# Patient Record
Sex: Male | Born: 1950 | Hispanic: No | Marital: Married | Smoking: Never smoker
Health system: Southern US, Community
[De-identification: ages and names within clinical notes are randomized; demographics above are authoritative.]

## PROBLEM LIST (undated history)

## (undated) DIAGNOSIS — H9190 Unspecified hearing loss, unspecified ear: Secondary | ICD-10-CM

## (undated) DIAGNOSIS — I10 Essential (primary) hypertension: Secondary | ICD-10-CM

## (undated) DIAGNOSIS — E7800 Pure hypercholesterolemia, unspecified: Secondary | ICD-10-CM

## (undated) HISTORY — DX: Unspecified hearing loss, unspecified ear: H91.90

## (undated) HISTORY — DX: Essential (primary) hypertension: I10

---

## 2009-12-18 ENCOUNTER — Encounter: Admission: RE | Admit: 2009-12-18 | Discharge: 2009-12-18 | Payer: Self-pay | Admitting: Infectious Diseases

## 2013-12-27 ENCOUNTER — Ambulatory Visit (INDEPENDENT_AMBULATORY_CARE_PROVIDER_SITE_OTHER): Payer: 59 | Admitting: Family Medicine

## 2013-12-27 VITALS — BP 152/98 | HR 78 | Temp 98.1°F | Resp 18 | Ht 65.5 in | Wt 165.0 lb

## 2013-12-27 DIAGNOSIS — H9190 Unspecified hearing loss, unspecified ear: Secondary | ICD-10-CM

## 2013-12-27 DIAGNOSIS — J309 Allergic rhinitis, unspecified: Secondary | ICD-10-CM

## 2013-12-27 DIAGNOSIS — J329 Chronic sinusitis, unspecified: Secondary | ICD-10-CM

## 2013-12-27 DIAGNOSIS — J302 Other seasonal allergic rhinitis: Secondary | ICD-10-CM

## 2013-12-27 DIAGNOSIS — E785 Hyperlipidemia, unspecified: Secondary | ICD-10-CM

## 2013-12-27 DIAGNOSIS — I10 Essential (primary) hypertension: Secondary | ICD-10-CM

## 2013-12-27 MED ORDER — FLUTICASONE PROPIONATE 50 MCG/ACT NA SUSP: 2.0000 | Freq: Every day | NASAL | Status: DC

## 2013-12-27 MED ORDER — AMOXICILLIN 500 MG PO CAPS: 500.0000 mg | ORAL_CAPSULE | Freq: Two times a day (BID) | ORAL | Status: DC

## 2013-12-27 MED ORDER — CHLORTHALIDONE 25 MG PO TABS: 25.0000 mg | ORAL_TABLET | Freq: Every day | ORAL | Status: DC

## 2013-12-27 NOTE — Progress Notes (Signed)
Chief Complaint:  Chief Complaint  Patient presents with  . Muscle Pain    off and on   . Hearing Loss  . Allergies  . Hyperlipidemia  . Hypertension    HPI: Todd Luna is a 63 y.o. male who is here for: 1. Allergy sxs after cutting grass, he has no SOB, CP, wheezing. Has tried claritin, has had cough, productive green, has had sinus pressure, HA. No ear pain or sore throat.  2. 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 He has not had feers or chills.  3. HTN-was on BP meds prior, name unknown, has been in [place], does not have PCP and has not taken meds.  4. He has a history of high cholesterol, may have been on meds int he past ate today already  Past Medical History  Diagnosis Date  . Hypertension   . Hearing loss    History reviewed. No pertinent past surgical history. History   Social History  . Marital Status: Married    Spouse Name: N/A    Number of Children: N/A  . Years of Education: N/A   Social History Main Topics  . Smoking status: Never Smoker   . Smokeless tobacco: None  . Alcohol Use: No  . Drug Use: No  . Sexual Activity: None   Other Topics Concern  . None   Social History Narrative  . None   History reviewed. No pertinent family history. No Known Allergies Prior to Admission medications   Medication Sig Start Date End Date Taking? Authorizing Provider  loratadine (CLARITIN) 10 MG tablet Take 10 mg by mouth daily.   Yes Historical Provider, MD     ROS: The patient denies fevers, chills, night sweats, unintentional weight loss, chest pain, palpitations, wheezing, dyspnea on exertion, nausea, vomiting, abdominal pain, dysuria, hematuria, melena, numbness, weakness, or tingling.   All other systems have been reviewed and were otherwise negative with the exception of those mentioned in the HPI and as above.    PHYSICAL EXAM: Filed Vitals:   12/27/13 1416  BP: 152/98  Pulse: 78  Temp: 98.1 F (36.7 C)   Resp: 18   Filed Vitals:   12/27/13 1416  Height: 5' 5.5" (1.664 m)  Weight: 165 lb (74.844 kg)   Body mass index is 27.03 kg/(m^2).  General: Alert, no acute distress HEENT:  Normocephalic, atraumatic, oropharynx patent. EOMI, PERRLA, +sinus tenderness, tm nl, no exudates Cardiovascular:  Regular rate and rhythm, no rubs murmurs or gallops.  No Carotid bruits, radial pulse intact. No pedal edema.  Respiratory: Clear to auscultation bilaterally.  No wheezes, rales, or rhonchi.  No cyanosis, no use of accessory musculature GI: No organomegaly, abdomen is soft and non-tender, positive bowel sounds.  No masses. Skin: No rashes. Neurologic: Facial musculature symmetric. Psychiatric: Patient is appropriate throughout our interaction. Lymphatic: No cervical lymphadenopathy Musculoskeletal: Gait intact.   LABS: No results found for this or any previous visit.   EKG/XRAY:   [facility]   ASSESSMENT/PLAN: Encounter Diagnoses  Name Primary?  . HTN (hypertension) Yes  . Other and unspecified hyperlipidemia   . Seasonal allergies   . Sinusitis    Will get labs in AM since not fasting Refer to ENT for audiology testing and hearing issues, he has had this for awhile and failed whisper test F/u in 1 month since restarting meds [id number] ( Paul---son)  Gross sideeffects,  risk and benefits, and alternatives of medications d/w patient. Patient is aware that all medications have potential sideeffects and we are unable to predict every sideeffect or drug-drug interaction that may occur.  Lenell Antuhao P Le, DO 12/27/2013 3:12 PM

## 2013-12-28 ENCOUNTER — Other Ambulatory Visit: Payer: Self-pay | Admitting: *Deleted

## 2013-12-28 ENCOUNTER — Other Ambulatory Visit (INDEPENDENT_AMBULATORY_CARE_PROVIDER_SITE_OTHER): Payer: 59

## 2013-12-28 DIAGNOSIS — I10 Essential (primary) hypertension: Secondary | ICD-10-CM

## 2013-12-28 DIAGNOSIS — E785 Hyperlipidemia, unspecified: Secondary | ICD-10-CM

## 2013-12-28 LAB — LIPID PANEL
Cholesterol: 256 mg/dL — ABNORMAL HIGH (ref 0–200)
HDL: 49 mg/dL (ref >39)
LDL Cholesterol: 172 mg/dL — ABNORMAL HIGH (ref 0–99)
Total CHOL/HDL Ratio: 5.2 ratio
Triglycerides: 175 mg/dL — ABNORMAL HIGH (ref <150)
VLDL: 35 mg/dL (ref 0–40)

## 2013-12-28 LAB — COMPREHENSIVE METABOLIC PANEL WITH GFR
Albumin: 4.6 g/dL (ref 3.5–5.2)
Alkaline Phosphatase: 72 U/L (ref 39–117)
BUN: 12 mg/dL (ref 6–23)
Calcium: 9.5 mg/dL (ref 8.4–10.5)
Creat: 0.93 mg/dL (ref 0.50–1.35)
Sodium: 135 meq/L (ref 135–145)
Total Bilirubin: 0.6 mg/dL (ref 0.2–1.2)
Total Protein: 8 g/dL (ref 6.0–8.3)

## 2013-12-28 LAB — COMPREHENSIVE METABOLIC PANEL
ALT: 27 U/L (ref 0–53)
AST: 29 U/L (ref 0–37)
CO2: 29 mEq/L (ref 19–32)
Chloride: 99 mEq/L (ref 96–112)
Glucose, Bld: 111 mg/dL — ABNORMAL HIGH (ref 70–99)
Potassium: 4.5 mEq/L (ref 3.5–5.3)

## 2013-12-28 LAB — TSH: TSH: 1.029 u[IU]/mL (ref 0.350–4.500)

## 2013-12-28 MED ORDER — CHLORTHALIDONE 25 MG PO TABS: 12.5000 mg | ORAL_TABLET | Freq: Every day | ORAL | Status: DC

## 2013-12-28 NOTE — Progress Notes (Signed)
Patient here for labs only. 

## 2013-12-29 ENCOUNTER — Telehealth: Payer: Self-pay | Admitting: Family Medicine

## 2013-12-29 ENCOUNTER — Encounter: Payer: Self-pay | Admitting: Family Medicine

## 2013-12-29 MED ORDER — ATORVASTATIN CALCIUM 20 MG PO TABS: 20.0000 mg | ORAL_TABLET | Freq: Every day | ORAL | Status: DC

## 2013-12-29 NOTE — Telephone Encounter (Signed)
LM on phone for son Renae Fickle( Paul) that his father's Cholesterol is high. He was on some choelsterol medication years ago. With HTN this increases risk for strokes and MI so will restart him on stain. He will be given Lipitor 20 mg and continue with Chlorthalidone 25 mg 1/2 tablet PO daily. Recheck in 2 months with fasting labs. Advise to make appt 104.

## 2014-03-09 ENCOUNTER — Ambulatory Visit: Payer: 59

## 2014-03-09 ENCOUNTER — Ambulatory Visit: Payer: 59 | Attending: Family Medicine | Admitting: Audiology

## 2014-03-09 DIAGNOSIS — H748X3 Other specified disorders of middle ear and mastoid, bilateral: Secondary | ICD-10-CM

## 2014-03-09 DIAGNOSIS — H9193 Unspecified hearing loss, bilateral: Secondary | ICD-10-CM

## 2014-03-09 DIAGNOSIS — H748X9 Other specified disorders of middle ear and mastoid, unspecified ear: Secondary | ICD-10-CM | POA: Insufficient documentation

## 2014-03-09 DIAGNOSIS — H9190 Unspecified hearing loss, unspecified ear: Secondary | ICD-10-CM | POA: Insufficient documentation

## 2014-03-09 DIAGNOSIS — H903 Sensorineural hearing loss, bilateral: Secondary | ICD-10-CM | POA: Diagnosis not present

## 2014-03-09 NOTE — Patient Instructions (Addendum)
CONCLUSIONS: Todd Luna has a hearing loss in both ears that is worse on the left side.  He needs to be seen by an Ear, Nose and Throat physician first because he has noticed more hearing loss in the past two years.  Todd Luna also needs a hearing evaluation as soon as possible because this degree of hearing loss interferes with communication.  Todd Luna, Au.D., CCC-A Doctor of Audiology 03/09/2014   References:  [employer] in [facility] may help with obtaining one hearing aid or one captioned telephone if hearing loss and financial qualifications are met.  Please contact Rex Kras at [phone] .  List of Providers for [employer] [facility] March 02, 2012 - March 01, 2014. The following hearing aid companies have contracted with [place] to fit hearing aids for eligible applicants. Representatives of these companies are not [facility] employees. Inclusion on this list means the company agrees to the terms of contract pricing and inclusion is not an endorsement of their services over those who are not contracted with the division. An applicant may choose any provider listed from your region to assist in obtaining hearing aids through this service. If problems arise during this process, please contact the [place] that provided the application.  [facility] Vendor List 1 Updated; December 28, 2012   [facility]  [place]   [place]  [phone]     Hearing Loss A hearing loss is sometimes called deafness. Hearing loss may be partial or total. CAUSES Hearing loss may be caused by:  Wax in the ear canal.  Infection of the ear canal.  Infection of the middle ear.  Trauma to the ear or surrounding area.  Fluid in the middle ear.  A hole in the eardrum (perforated eardrum).  Exposure to loud sounds or music.  Problems with the hearing nerve.  Certain  medications. Hearing loss without wax, infection, or a history of injury may mean that the nerve is involved. Hearing loss with severe dizziness, nausea and vomiting or ringing in the ear may suggest a hearing nerve irritation or problems in the middle or inner ear. If hearing loss is untreated, there is a greater likelihood for residual or permanent hearing loss. DIAGNOSIS A hearing test (audiometry) assesses hearing loss. The audiometry test needs to be performed by a hearing specialist (audiologist). TREATMENT Treatment for recent onset of hearing loss may include:  Ear wax removal.  Medications that kill germs (antibiotics).  Cortisone medications.  Prompt follow up with the appropriate specialist. Return of hearing depends on the cause of your hearing loss, so proper medical follow-up is important. Some hearing loss may not be reversible, and a caregiver should discuss care and treatment options with you. SEEK MEDICAL CARE IF:   You have a severe headache, dizziness, or changes in vision.  You have new or increased weakness.  You develop repeated vomiting or other serious medical problems.  You have a fever. Document Released: 08/19/2005 Document Revised: 11/11/2011 Document Reviewed: 12/14/2009 [facility] Patient Information 2015 [place], [place]. This information is not intended to replace advice given to you by your health care provider. Make sure you discuss any questions you have with your health care provider.

## 2014-03-09 NOTE — Procedures (Signed)
[facility] [address] [place],  [address] [place] EVALUATION  Name: Todd Luna DOB:  07-10-1951 MRN:  [id number]                                 Diagnosis: hearing loss Date: 03/09/2014    Referent: Leotis Pain, DO  HISTORY: Scorpio Fortin, age 63 y.o. years, was seen for an audiological evaluation.  He speaks vietnamese only. His son, who speaks English accompanied him.  Mr. Quincy has noticed a decrease in hearing over the past four years, but the hearing has become much poorer on the left side over the past two years.  Mr. Westervelt and the family report great communication problems at home and that although he has been attending [employer] to learn English - the teacher must "sit very close to him before he can hear".  Mr. Bialecki has told his family that he has "lost his hearing" and is deaf.  Mr. Maciver denies vertigo, balance issues or tinnitus.  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         EVALUATION: Pure tone air and tone conduction was completed using conventional audiometry with inserts. The left ear is poorer with 55-65 dBHL from '250Hz' - '1000Hz' ; 80-85dBHL from '1000Hz' -'8000Hz' .  The right ear hearing thresholds are 35-50 dBHL from '250Hz'  - '1000Hz' ; 75-85 dBHL from '1500Hz'  - '8000Hz' .  The hearing loss is sensorineural bilaterally. Speech detection is 35 dBHL in the right ear and 60 dBHL in the left ear using recorded multitalker noise.  The reliability is good.  Otoscopic inspection reveals clear ear canals with cloudy tympanic membranes, without redness. Tympanometry was abnormal bilaterally because of the rounded configuration and elevated/absent acoustic reflexes.    CONCLUSION:  Mr. Mysliwiec has a hearing loss in both ears that is worse on the left side.  He needs to be seen by an Ear, Nose and Throat physician first because he has noticed more hearing loss in the past two years.  Mr. Wilkinson also needs a hearing  evaluation as soon as possible because this degree of hearing loss creates severe communication difficulty.  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  Word recognition testing was unable to be tested due to the language barrier.  [place] may be an excellent candidate for amplification therefore a hearing aid evaluation is recommended.  His son is going to call the insurance company to inquire about hearing aid coverage. However, please be aware that   RECOMMENDATIONS: 1.   Evaluation by an Ear, Nose and Throat physician because of a) reported deterioration of left ear hearing over the past two years b) asymmetrical low frequency hearing loss and c) cloudy appearance of tympanic membranes.  2.   Hearing aid evaluation once medical clearance is obtained. Please be aware that if Mr. Scerbo insurance does not pay for hearing aids that other funding may be available. [facility] ENT  [place]  [phone] participates with [employer] in [place] may help with obtaining one hearing aid or one captioned telephone if hearing loss and financial qualifications are met.  Please contact Rex Kras at [phone] .  3.   Monitor hearing closely with a repeat  audiological evaluation in 3-6 months (earlier if there is any change in hearing or ear pressure).   Vannessa Godown L. Heide Spark, Au.D., CCC-A Doctor of Audiology 03/09/2014  cc: Leotis Pain, DO

## 2014-03-10 ENCOUNTER — Ambulatory Visit (INDEPENDENT_AMBULATORY_CARE_PROVIDER_SITE_OTHER): Payer: 59 | Admitting: Family Medicine

## 2014-03-10 VITALS — BP 132/60 | HR 77 | Temp 97.8°F | Resp 16 | Ht 65.0 in | Wt 162.0 lb

## 2014-03-10 DIAGNOSIS — I10 Essential (primary) hypertension: Secondary | ICD-10-CM

## 2014-03-10 DIAGNOSIS — H9193 Unspecified hearing loss, bilateral: Secondary | ICD-10-CM

## 2014-03-10 DIAGNOSIS — M545 Low back pain, unspecified: Secondary | ICD-10-CM

## 2014-03-10 DIAGNOSIS — E785 Hyperlipidemia, unspecified: Secondary | ICD-10-CM

## 2014-03-10 DIAGNOSIS — H9190 Unspecified hearing loss, unspecified ear: Secondary | ICD-10-CM

## 2014-03-10 LAB — COMPREHENSIVE METABOLIC PANEL
ALT: 21 U/L (ref 0–53)
AST: 24 U/L (ref 0–37)
Albumin: 4.5 g/dL (ref 3.5–5.2)
Alkaline Phosphatase: 69 U/L (ref 39–117)
BILIRUBIN TOTAL: 0.6 mg/dL (ref 0.2–1.2)
BUN: 10 mg/dL (ref 6–23)
CALCIUM: 9.7 mg/dL (ref 8.4–10.5)
CO2: 27 mEq/L (ref 19–32)
Chloride: 97 mEq/L (ref 96–112)
Creat: 0.97 mg/dL (ref 0.50–1.35)
Glucose, Bld: 107 mg/dL — ABNORMAL HIGH (ref 70–99)
POTASSIUM: 4.6 meq/L (ref 3.5–5.3)
Sodium: 133 mEq/L — ABNORMAL LOW (ref 135–145)
TOTAL PROTEIN: 7.5 g/dL (ref 6.0–8.3)

## 2014-03-10 LAB — LIPID PANEL
Cholesterol: 184 mg/dL (ref 0–200)
HDL: 44 mg/dL (ref >39)
LDL CALC: 114 mg/dL — AB (ref 0–99)
Total CHOL/HDL Ratio: 4.2 Ratio
Triglycerides: 129 mg/dL (ref <150)
VLDL: 26 mg/dL (ref 0–40)

## 2014-03-10 NOTE — Progress Notes (Signed)
   Subjective:    Patient ID: Todd Luna, male    DOB: 03/31/1951, 63 y.o.   MRN: [id number]  HPI This is a very pleasant 63 yo male who is brought in today by his son in law for follow up of HTN, hyperlipidemia and request for ENT referral for bilateral hearing loss. Patient had audiology evaluation yesterday that showed the following: CONCLUSIONS:  [name] has a hearing loss in both ears that is worse on the left side. He needs to be seen by an Ear, Nose and Throat physician first because he has noticed more hearing loss in the past two years. [name] also needs a hearing evaluation as soon as possible because this degree of hearing loss interferes with communication.  Dyan Creelman L. Kate SableWoodward, Au.D., CCC-A  Doctor of Audiology  03/09/2014  He is tolerating his medications without side effects.  He reports that he has occasional low back pain with lawn mowing. It is not persistent, nor does it radiate. [place].   Review of Systems No chest pain, no SOB    Objective:   Physical Exam  Vitals reviewed. Constitutional: He is oriented to person, place, and time. He appears well-developed and well-nourished.  HENT:  Head: Normocephalic and atraumatic.  Right Ear: Tympanic membrane, external ear and ear canal normal. No decreased hearing is noted.  Left Ear: Tympanic membrane, external ear and ear canal normal. No decreased hearing is noted.  Nose: Nose normal.  Mouth/Throat: Oropharynx is clear and moist.  Eyes: Conjunctivae are normal. Right eye exhibits no discharge. Left eye exhibits no discharge. No scleral icterus.  Neck: Normal range of motion. Neck supple.  Cardiovascular: Normal rate, regular rhythm and normal heart sounds.   Pulmonary/Chest: Effort normal and breath sounds normal.  Musculoskeletal: Normal range of motion. He exhibits no edema and no tenderness.  Neurological: He is alert and oriented to person, place, and time. He has normal reflexes.  Skin:  Skin is warm and dry.  Psychiatric: He has a normal mood and affect. His behavior is normal. Judgment and thought content normal.      Assessment & Plan:  1. Essential hypertension -well controlled on current med - Comprehensive metabolic panel - Lipid panel  2. Other and unspecified hyperlipidemia - Lipid panel  3. Hearing loss, bilateral - Ambulatory referral to ENT  4. Midline low back pain without sciatica -no findings on PE -this is an intermittent problem, for which he has not tried any treatment, recommended acetaminophen and moist heat  -Follow up with Dr. Conley RollsLe in 6 months   Emi Belfasteborah B. Kirk Sampley, FNP-BC  [facility], [facility]  03/10/2014 9:30 AM

## 2014-03-10 NOTE — Patient Instructions (Signed)
Our office will call you with an appointment for the ear nose and throat doctor

## 2014-09-06 ENCOUNTER — Encounter (HOSPITAL_COMMUNITY): Payer: Self-pay | Admitting: *Deleted

## 2014-09-06 ENCOUNTER — Inpatient Hospital Stay (HOSPITAL_COMMUNITY)
Admission: EM | Admit: 2014-09-06 | Discharge: 2014-09-08 | DRG: 640 | Disposition: A | Discharge status: Home / Self Care | Payer: Medicaid Other | Attending: Internal Medicine | Admitting: Internal Medicine

## 2014-09-06 DIAGNOSIS — H903 Sensorineural hearing loss, bilateral: Secondary | ICD-10-CM | POA: Diagnosis present

## 2014-09-06 DIAGNOSIS — Z7982 Long term (current) use of aspirin: Secondary | ICD-10-CM

## 2014-09-06 DIAGNOSIS — E876 Hypokalemia: Secondary | ICD-10-CM | POA: Diagnosis present

## 2014-09-06 DIAGNOSIS — E871 Hypo-osmolality and hyponatremia: Principal | ICD-10-CM | POA: Diagnosis present

## 2014-09-06 DIAGNOSIS — R69 Illness, unspecified: Secondary | ICD-10-CM

## 2014-09-06 DIAGNOSIS — J020 Streptococcal pharyngitis: Secondary | ICD-10-CM | POA: Diagnosis present

## 2014-09-06 DIAGNOSIS — J189 Pneumonia, unspecified organism: Secondary | ICD-10-CM | POA: Diagnosis present

## 2014-09-06 DIAGNOSIS — R066 Hiccough: Secondary | ICD-10-CM | POA: Insufficient documentation

## 2014-09-06 DIAGNOSIS — J111 Influenza due to unidentified influenza virus with other respiratory manifestations: Secondary | ICD-10-CM

## 2014-09-06 DIAGNOSIS — Z8673 Personal history of transient ischemic attack (TIA), and cerebral infarction without residual deficits: Secondary | ICD-10-CM

## 2014-09-06 DIAGNOSIS — I10 Essential (primary) hypertension: Secondary | ICD-10-CM | POA: Diagnosis present

## 2014-09-06 DIAGNOSIS — R911 Solitary pulmonary nodule: Secondary | ICD-10-CM | POA: Diagnosis present

## 2014-09-06 LAB — CBC
HEMATOCRIT: 41.7 % (ref 39.0–52.0)
Hemoglobin: 13.5 g/dL (ref 13.0–17.0)
MCH: 25.1 pg — ABNORMAL LOW (ref 26.0–34.0)
MCHC: 32.4 g/dL (ref 30.0–36.0)
MCV: 77.5 fL — AB (ref 78.0–100.0)
PLATELETS: 285 10*3/uL (ref 150–400)
RBC: 5.38 MIL/uL (ref 4.22–5.81)
RDW: 12 % (ref 11.5–15.5)
WBC: 8.7 10*3/uL (ref 4.0–10.5)

## 2014-09-06 MED ORDER — SODIUM CHLORIDE 0.9 % IV BOLUS (SEPSIS)
1000.0000 mL | Freq: Once | INTRAVENOUS | Status: AC
  Administered 2014-09-06: 1000 mL via INTRAVENOUS

## 2014-09-06 MED ORDER — CHLORPROMAZINE HCL 25 MG/ML IJ SOLN
25.0000 mg | Freq: Once | INTRAMUSCULAR | Status: AC
  Administered 2014-09-06: 25 mg via INTRAVENOUS
  Filled 2014-09-06: qty 1

## 2014-09-06 NOTE — ED Provider Notes (Signed)
CSN: [id number]     Arrival date & time 09/06/14  2028 History   First MD Initiated Contact with Patient 09/06/14 2105     Chief Complaint  Patient presents with  . Hiccups  . Influenza    Patient with c/o flu like symptoms, per family member  . Sore Throat     (Consider location/radiation/quality/duration/timing/severity/associated sxs/prior Treatment) Patient is a 64 y.o. male presenting with flu symptoms and pharyngitis. The history is provided by the patient and medical records. A language interpreter was used (Pt's son).  Influenza Presenting symptoms: sore throat   Presenting symptoms: no cough, no diarrhea, no fatigue, no fever, no headaches, no nausea, no shortness of breath and no vomiting   Associated symptoms: no neck stiffness   Sore Throat Associated symptoms include a sore throat. Pertinent negatives include no abdominal pain, chest pain, coughing, diaphoresis, fatigue, fever, headaches, nausea, rash or vomiting.     Byrl Latin is a 64 y.o. 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  Patient's son translates for him. He reports 6 days ago he came down with an influenza-like illness including sore throat, rhinorrhea, sinus congestion, cough. He reports that 3 days ago he was seen in an urgent care and diagnosed with strep throat. He was given an unknown antibiotic and reports that his throat has improved since that time.  He reports that his hiccups have progressively worsened. He reports it is difficult to eat due to this. He had one episode of emesis at the beginning of his influenza-like illness but none since that time. Patient reports tactile fevers at the beginning of the illness but none since that time.  He denies chills, headache, neck pain, difficulty swallowing, tightness of breath, abdominal pain, nausea, weakness,  dysuria, hematuria, syncope.  Patient does endorse a strange discomfort in his chest which he attributes to his headache.  Patient reports that hiccups often wake him from sleep and family reports that they can see him hiccuping while he sleeps.  Past Medical History  Diagnosis Date  . Hypertension   . Hearing loss    History reviewed. No pertinent past surgical history. History reviewed. No pertinent family history. History  Substance Use Topics  . Smoking status: Never Smoker   . Smokeless tobacco: Not on file  . Alcohol Use: Yes     Comment: social    Review of Systems  Constitutional: Negative for fever, diaphoresis, appetite change, fatigue and unexpected weight change.       Hiccups  HENT: Positive for sore throat. Negative for mouth sores.   Eyes: Negative for visual disturbance.  Respiratory: Negative for cough, chest tightness, shortness of breath and wheezing.   Cardiovascular: Negative for chest pain.       Chest discomfort  Gastrointestinal: Negative for nausea, vomiting, abdominal pain, diarrhea and constipation.  Endocrine: Negative for polydipsia, polyphagia and polyuria.  Genitourinary: Negative for dysuria, urgency, frequency and hematuria.  Musculoskeletal: Negative for back pain and neck stiffness.  Skin: Negative for rash.  Allergic/Immunologic: Negative for immunocompromised state.  Neurological: Negative for syncope, light-headedness and headaches.  Hematological: Does not bruise/bleed easily.  Psychiatric/Behavioral: Negative for sleep disturbance. The patient is not nervous/anxious.       Allergies  Review of patient's allergies indicates no known allergies.  Home Medications   Prior to Admission medications   Medication  Sig Start Date End Date Taking? Authorizing Provider  amoxicillin (AMOXIL) 500 MG capsule Take 1,000 mg by mouth 2 (two) times daily. For 7 days   Yes Historical Provider, MD  aspirin-sod bicarb-citric acid (ALKA-SELTZER) 325 MG  TBEF tablet Take 325 mg by mouth every 6 (six) hours as needed (flu like symptoms).   Yes Historical Provider, MD  atorvastatin (LIPITOR) 20 MG tablet Take 1 tablet (20 mg total) by mouth at bedtime. Please call if you have constant muscle aches or pains 12/29/13  Yes Thao P Le, DO  chlorthalidone (HYGROTON) 25 MG tablet Take 0.5 tablets (12.5 mg total) by mouth daily. Blood pressure goal is less than 150/90. 12/28/13  Yes Thao P Le, DO  promethazine (PHENERGAN) 25 MG tablet Take 25 mg by mouth every 6 (six) hours as needed for nausea or vomiting (hiccups).   Yes Historical Provider, MD  fluticasone (FLONASE) 50 MCG/ACT nasal spray Place 2 sprays into both nostrils daily. Patient not taking: Reported on 09/06/2014 12/27/13   Thao P Le, DO   BP 94/48 mmHg  Pulse 83  Temp(Src) 98 F (36.7 C) (Oral)  Resp 12  SpO2 92% Physical Exam  Constitutional: He is oriented to person, place, and time. He appears well-developed and well-nourished. No distress.  Awake, alert, nontoxic appearance  HENT:  Head: Normocephalic and atraumatic.  Right Ear: Tympanic membrane, external ear and ear canal normal.  Left Ear: Tympanic membrane, external ear and ear canal normal.  Nose: Nose normal. No mucosal edema or rhinorrhea.  Mouth/Throat: Uvula is midline, oropharynx is clear and moist and mucous membranes are normal. Mucous membranes are not dry. No trismus in the jaw. No uvula swelling. No oropharyngeal exudate or tonsillar abscesses.  Posterior oropharynx with erythema and edema but no exudate on the tonsils  Eyes: Conjunctivae and EOM are normal. Pupils are equal, round, and reactive to light. No scleral icterus.  No horizontal, vertical or rotational nystagmus  Neck: Normal range of motion, full passive range of motion without pain and phonation normal. Neck supple. No tracheal tenderness, no spinous process tenderness and no muscular tenderness present. No rigidity. No erythema and normal range of motion  present. No Brudzinski's sign and no Kernig's sign noted.  Full active and passive ROM without pain No midline or paraspinal tenderness No nuchal rigidity or meningeal signs Normal Phonation  Cardiovascular: Normal rate, regular rhythm, normal heart sounds and intact distal pulses.   No murmur heard. Pulses:      Radial pulses are 2+ on the right side, and 2+ on the left side.  Pulmonary/Chest: Effort normal and breath sounds normal. No stridor. No respiratory distress. He has no decreased breath sounds. He has no wheezes. He has no rales.  Equal chest expansion  Abdominal: Soft. Bowel sounds are normal. He exhibits no mass. There is no tenderness. There is no rebound and no guarding.  Musculoskeletal: Normal range of motion. He exhibits no edema.  Lymphadenopathy:       Head (right side): Submandibular and tonsillar adenopathy present. No submental, no preauricular, no posterior auricular and no occipital adenopathy present.       Head (left side): Submandibular and tonsillar adenopathy present. No submental, no preauricular, no posterior auricular and no occipital adenopathy present.    He has no cervical adenopathy.       Right cervical: No superficial cervical, no deep cervical and no posterior cervical adenopathy present.      Left cervical: No superficial cervical, no deep cervical  and no posterior cervical adenopathy present.  Neurological: He is alert and oriented to person, place, and time. He has normal reflexes. No cranial nerve deficit. He exhibits normal muscle tone. Coordination normal.  Mental Status:  Alert, oriented, thought content appropriate. Speech fluent without evidence of aphasia. Able to follow 2 step commands without difficulty.  Cranial Nerves:  II:  Peripheral visual fields grossly normal, pupils equal, round, reactive to light III,IV, VI: ptosis not present, extra-ocular motions intact bilaterally  V,VII: smile symmetric, facial light touch sensation equal VIII:  hearing grossly normal bilaterally  IX,X: gag reflex present  XI: bilateral shoulder shrug equal and strong XII: midline tongue extension  Motor:  5/5 in upper and lower extremities bilaterally including strong and equal grip strength and dorsiflexion/plantar flexion Sensory: Pinprick and light touch normal in all extremities.  Deep Tendon Reflexes: 2+ and symmetric  Cerebellar: normal finger-to-nose with bilateral upper extremities Gait: normal gait and balance CV: distal pulses palpable throughout  No clonus  Skin: Skin is warm and dry. No rash noted. He is not diaphoretic.  Psychiatric: He has a normal mood and affect. His behavior is normal. Judgment and thought content normal.  Nursing note and vitals reviewed.   ED Course  Procedures (including critical care time) Labs Review Labs Reviewed  CBC - Abnormal; Notable for the following:    MCV 77.5 (*)    MCH 25.1 (*)    All other components within normal limits  COMPREHENSIVE METABOLIC PANEL - Abnormal; Notable for the following:    Sodium 121 (*)    Potassium 3.4 (*)    Chloride 82 (*)    Glucose, Bld 113 (*)    AST 68 (*)    All other components within normal limits  RAPID STREP SCREEN  TROPONIN I  LIPASE, BLOOD    Imaging Review Dg Chest 2 View  09/07/2014   CLINICAL DATA:  Sore throat, intermittent fever, shortness of breath and hiccups for 1 week.  EXAM: CHEST  2 VIEW  COMPARISON:  Chest radiograph December 18, 2009  FINDINGS: Cardiac silhouette is mildly enlarged, mediastinal silhouette is nonsuspicious. Low inspiratory examination with bibasilar patchy airspace opacities, no pleural effusion. No pneumothorax. Soft tissue planes and included osseous structures are nonsuspicious.  IMPRESSION: Patchy bibasilar airspace opacities may reflect atelectasis, less likely pneumonia in this low inspiratory examination.  Mild cardiomegaly.   Electronically Signed   By: Awilda Metroourtnay  Bloomer   On: 09/07/2014 01:21     EKG  Interpretation   Date/Time:  Tuesday September 06 2014 21:40:42 EST Ventricular Rate:  82 PR Interval:  172 QRS Duration: 93 QT Interval:  378 QTC Calculation: 441 R Axis:   72 Text Interpretation:  Sinus rhythm Nonspecific ST abnormality likely early  repol No old tracing to compare Confirmed by MILLER  MD, BRIAN ([id number]) on  09/06/2014 9:46:10 PM      MDM   Final diagnoses:  Intractable hiccups  Hyponatremia  Influenza-like illness    Laurelyn SickleYtueh Soja presents with persistent hiccups.  Influenza-like symptoms throughout this week with a New. Will Obtain Labs, Chest X-Ray, EKG, Give Thorazine and Reassess.  11:00PM Labs pending. Thorazine has improved hiccups.  12:58 AM CBC reassuring however patient with low sodium to 121 on CMP. Chloride 82 and potassium 3.4. Twelve-lead nonischemic. Troponin negative and lipase within normal limits. Will admit for hyponatremia of unknown origin. Chest x-ray pending.  1:25 AM Discussed with Dr. Alvester MorinNewton who will admit.    The patient was discussed with  and seen by Dr. Nehemiah Massed who agrees with the treatment plan.   BP 94/48 mmHg  Pulse 83  Temp(Src) 98 F (36.7 C) (Oral)  Resp 12  SpO2 92%   Dierdre Forth, PA-C 09/07/14 0125  Elwin Mocha, MD 09/08/14 [phone]

## 2014-09-06 NOTE — ED Notes (Signed)
Patient seen at urgent care three days ago and was dx with strep throat Patient here in ED for hiccups which started at midnight Per family members, "Something is blocked in the stomach area. He has hiccups really bad. If anything touches his stomach, he starts to hiccup real bad." Patient also with c/o "flu symptoms", per family member Patient alert and oriented x 4 in ED triage room--no hiccups at this time Family members at bedside to translate

## 2014-09-07 ENCOUNTER — Observation Stay (HOSPITAL_COMMUNITY): Payer: Medicaid Other

## 2014-09-07 ENCOUNTER — Emergency Department (HOSPITAL_COMMUNITY): Payer: Medicaid Other

## 2014-09-07 DIAGNOSIS — R066 Hiccough: Secondary | ICD-10-CM | POA: Diagnosis present

## 2014-09-07 DIAGNOSIS — E876 Hypokalemia: Secondary | ICD-10-CM

## 2014-09-07 DIAGNOSIS — J020 Streptococcal pharyngitis: Secondary | ICD-10-CM | POA: Diagnosis present

## 2014-09-07 DIAGNOSIS — H903 Sensorineural hearing loss, bilateral: Secondary | ICD-10-CM | POA: Diagnosis present

## 2014-09-07 DIAGNOSIS — Z8673 Personal history of transient ischemic attack (TIA), and cerebral infarction without residual deficits: Secondary | ICD-10-CM | POA: Diagnosis not present

## 2014-09-07 DIAGNOSIS — I10 Essential (primary) hypertension: Secondary | ICD-10-CM

## 2014-09-07 DIAGNOSIS — R911 Solitary pulmonary nodule: Secondary | ICD-10-CM | POA: Diagnosis present

## 2014-09-07 DIAGNOSIS — Z7982 Long term (current) use of aspirin: Secondary | ICD-10-CM | POA: Diagnosis not present

## 2014-09-07 DIAGNOSIS — E871 Hypo-osmolality and hyponatremia: Secondary | ICD-10-CM | POA: Diagnosis not present

## 2014-09-07 DIAGNOSIS — I517 Cardiomegaly: Secondary | ICD-10-CM

## 2014-09-07 DIAGNOSIS — J189 Pneumonia, unspecified organism: Secondary | ICD-10-CM | POA: Diagnosis present

## 2014-09-07 LAB — COMPREHENSIVE METABOLIC PANEL
ALT: 47 U/L (ref 0–53)
ALT: 40 U/L (ref 0–53)
AST: 68 U/L — ABNORMAL HIGH (ref 0–37)
AST: 53 U/L — AB (ref 0–37)
Albumin: 4 g/dL (ref 3.5–5.2)
Albumin: 3.5 g/dL (ref 3.5–5.2)
Alkaline Phosphatase: 74 U/L (ref 39–117)
Alkaline Phosphatase: 63 U/L (ref 39–117)
Anion gap: 8 (ref 5–15)
Anion gap: 10 (ref 5–15)
BUN: 9 mg/dL (ref 6–23)
BUN: 10 mg/dL (ref 6–23)
CALCIUM: 8.8 mg/dL (ref 8.4–10.5)
CHLORIDE: 82 meq/L — AB (ref 96–112)
CHLORIDE: 90 meq/L — AB (ref 96–112)
CO2: 31 mmol/L (ref 19–32)
CO2: 27 mmol/L (ref 19–32)
CREATININE: 0.79 mg/dL (ref 0.50–1.35)
CREATININE: 0.77 mg/dL (ref 0.50–1.35)
Calcium: 8.6 mg/dL (ref 8.4–10.5)
Glucose, Bld: 113 mg/dL — ABNORMAL HIGH (ref 70–99)
Glucose, Bld: 116 mg/dL — ABNORMAL HIGH (ref 70–99)
Potassium: 3.4 mmol/L — ABNORMAL LOW (ref 3.5–5.1)
Potassium: 3.6 mmol/L (ref 3.5–5.1)
SODIUM: 127 mmol/L — AB (ref 135–145)
Sodium: 121 mmol/L — ABNORMAL LOW (ref 135–145)
TOTAL PROTEIN: 7.6 g/dL (ref 6.0–8.3)
Total Bilirubin: 0.6 mg/dL (ref 0.3–1.2)
Total Bilirubin: 0.7 mg/dL (ref 0.3–1.2)
Total Protein: 6.5 g/dL (ref 6.0–8.3)

## 2014-09-07 LAB — URINALYSIS, ROUTINE W REFLEX MICROSCOPIC
BILIRUBIN URINE: NEGATIVE
Glucose, UA: NEGATIVE mg/dL
HGB URINE DIPSTICK: NEGATIVE
Ketones, ur: NEGATIVE mg/dL
Leukocytes, UA: NEGATIVE
Nitrite: NEGATIVE
PROTEIN: NEGATIVE mg/dL
Specific Gravity, Urine: 1.003 — ABNORMAL LOW (ref 1.005–1.030)
Urobilinogen, UA: 0.2 mg/dL (ref 0.0–1.0)
pH: 6.5 (ref 5.0–8.0)

## 2014-09-07 LAB — SODIUM, URINE, RANDOM: Sodium, Ur: <10 mmol/L

## 2014-09-07 LAB — CBC WITH DIFFERENTIAL/PLATELET
BASOS ABS: 0.1 10*3/uL (ref 0.0–0.1)
Basophils Relative: 1 % (ref 0–1)
EOS ABS: 0.2 10*3/uL (ref 0.0–0.7)
EOS PCT: 2 % (ref 0–5)
HEMATOCRIT: 38.8 % — AB (ref 39.0–52.0)
HEMOGLOBIN: 13.2 g/dL (ref 13.0–17.0)
Lymphocytes Relative: 18 % (ref 12–46)
Lymphs Abs: 1.6 10*3/uL (ref 0.7–4.0)
MCH: 26 pg (ref 26.0–34.0)
MCHC: 34 g/dL (ref 30.0–36.0)
MCV: 76.5 fL — ABNORMAL LOW (ref 78.0–100.0)
MONO ABS: 1 10*3/uL (ref 0.1–1.0)
Monocytes Relative: 12 % (ref 3–12)
NEUTROS PCT: 67 % (ref 43–77)
Neutro Abs: 5.9 10*3/uL (ref 1.7–7.7)
PLATELETS: 247 10*3/uL (ref 150–400)
RBC: 5.07 MIL/uL (ref 4.22–5.81)
RDW: 12.2 % (ref 11.5–15.5)
WBC: 8.7 10*3/uL (ref 4.0–10.5)

## 2014-09-07 LAB — OSMOLALITY: Osmolality: 259 mOsm/kg — ABNORMAL LOW (ref 275–300)

## 2014-09-07 LAB — LIPASE, BLOOD: LIPASE: 27 U/L (ref 11–59)

## 2014-09-07 LAB — MAGNESIUM: Magnesium: 2 mg/dL (ref 1.5–2.5)

## 2014-09-07 LAB — OSMOLALITY, URINE: Osmolality, Ur: 69 mOsm/kg — ABNORMAL LOW (ref 390–1090)

## 2014-09-07 LAB — TROPONIN I: Troponin I: <0.03 ng/mL (ref <0.031)

## 2014-09-07 LAB — RAPID STREP SCREEN (MED CTR MEBANE ONLY): STREPTOCOCCUS, GROUP A SCREEN (DIRECT): NEGATIVE

## 2014-09-07 LAB — TSH: TSH: 0.633 u[IU]/mL (ref 0.350–4.500)

## 2014-09-07 LAB — BRAIN NATRIURETIC PEPTIDE: B Natriuretic Peptide: 24.7 pg/mL (ref 0.0–100.0)

## 2014-09-07 MED ORDER — LEVOFLOXACIN IN D5W 750 MG/150ML IV SOLN
750.0000 mg | Freq: Every day | INTRAVENOUS | Status: DC
  Filled 2014-09-07: qty 150

## 2014-09-07 MED ORDER — AMOXICILLIN 500 MG PO CAPS
1000.0000 mg | ORAL_CAPSULE | Freq: Two times a day (BID) | ORAL | Status: DC
  Filled 2014-09-07 (×2): qty 2

## 2014-09-07 MED ORDER — CHLORPROMAZINE HCL 50 MG PO TABS
50.0000 mg | ORAL_TABLET | Freq: Four times a day (QID) | ORAL | Status: DC | PRN
  Administered 2014-09-07: 50 mg via ORAL
  Filled 2014-09-07 (×2): qty 1

## 2014-09-07 MED ORDER — PNEUMOCOCCAL VAC POLYVALENT 25 MCG/0.5ML IJ INJ: 0.5000 mL | INJECTION | INTRAMUSCULAR | Status: DC

## 2014-09-07 MED ORDER — SODIUM CHLORIDE 0.9 % IJ SOLN: 3.0000 mL | INTRAMUSCULAR | Status: DC | PRN

## 2014-09-07 MED ORDER — DEXTROSE 5 % IV SOLN
500.0000 mg | INTRAVENOUS | Status: DC
  Administered 2014-09-07: 500 mg via INTRAVENOUS
  Filled 2014-09-07 (×2): qty 500

## 2014-09-07 MED ORDER — SODIUM CHLORIDE 0.9 % IJ SOLN
3.0000 mL | Freq: Two times a day (BID) | INTRAMUSCULAR | Status: DC
  Administered 2014-09-07 (×3): 3 mL via INTRAVENOUS

## 2014-09-07 MED ORDER — SODIUM CHLORIDE 0.9 % IV SOLN: 250.0000 mL | INTRAVENOUS | Status: DC | PRN

## 2014-09-07 MED ORDER — HEPARIN SODIUM (PORCINE) 5000 UNIT/ML IJ SOLN
5000.0000 [IU] | Freq: Three times a day (TID) | INTRAMUSCULAR | Status: DC
  Administered 2014-09-07 – 2014-09-08 (×4): 5000 [IU] via SUBCUTANEOUS
  Filled 2014-09-07 (×7): qty 1

## 2014-09-07 MED ORDER — GUAIFENESIN 100 MG/5ML PO SOLN: 200.0000 mg | ORAL | Status: DC | PRN

## 2014-09-07 MED ORDER — SODIUM CHLORIDE 0.9 % IV SOLN
250.0000 mL | INTRAVENOUS | Status: DC
Start: 2014-09-07 — End: 2014-09-07
  Administered 2014-09-07: 250 mL via INTRAVENOUS

## 2014-09-07 MED ORDER — CEFTRIAXONE SODIUM IN DEXTROSE 20 MG/ML IV SOLN
1.0000 g | INTRAVENOUS | Status: DC
  Administered 2014-09-07: 1 g via INTRAVENOUS
  Filled 2014-09-07 (×2): qty 50

## 2014-09-07 MED ORDER — ALBUTEROL SULFATE (2.5 MG/3ML) 0.083% IN NEBU
2.5000 mg | INHALATION_SOLUTION | Freq: Three times a day (TID) | RESPIRATORY_TRACT | Status: DC
  Administered 2014-09-07 – 2014-09-08 (×4): 2.5 mg via RESPIRATORY_TRACT
  Filled 2014-09-07 (×4): qty 3

## 2014-09-07 MED ORDER — BACLOFEN 5 MG HALF TABLET
5.0000 mg | ORAL_TABLET | Freq: Two times a day (BID) | ORAL | Status: DC
  Administered 2014-09-07: 5 mg via ORAL
  Filled 2014-09-07 (×2): qty 1

## 2014-09-07 NOTE — H&P (Addendum)
Hospitalist Admission History and Physical  Patient name: Todd Luna Medical record number: [id number] Date of birth: 1951/03/09 Age: 64 y.o. Gender: male  Primary Care Provider: Rockne Coons, DO  Chief Complaint: hyponatremia, hiccups  History of Present Illness:This is a 64 y.o. year old male with significant past medical history of HTN, bilateral hearing loss presenting with hiccups, hyponatremia. Per the family, pt was recently seen at local urgent care for strep sxs. Was started on amox. Sxs have improved since this point. Per family, pt has had recurrent hiccups at home. Family states that hiccups are constant, intermittent-but seemed to be worsened by anything that touches pt's stomach. No fevers or chills. No SOB or chest. Family states that pt has been taking a lot of water as well as NSAIDs. Has noticed pt w/ some LE swelling, though no orthopnea/PND. Pt originally from [place]. Has lived in states for 5 years. No known prior medical issues.  Presents to the ER temperature 90.8, heart rate in the 80s, respirations in the tens, blood pressure in the 90s to 140s. Satting 97% on room air. White blood cell count 8.7, hemoglobin 13.5, creatinine 0.79, potassium 3.4, sodium 121. Rapid strep negative. Chest x-ray shows patchy bibasilar airspace opacities which may reflect atelectasis and less likely pneumonia. Positive mild cardiomegaly.  Assessment and Plan: Todd Luna is a 64 y.o. year old male presenting with hyponatremia,   Active Problems:   Hyponatremia   1- Hyponatremia -Broad differential diagnosis for symptoms -Fairly euvolemic with? Trace lower extremity swelling -Check BNP given cardiomegaly on chest x-ray -2-D echo -CT chest to better assess ? Infiltrate and cardiomegaly -Urine sodium -Urine and serum osmolality -Hold offending agent (chlorthalidone) -Serial bmets -fluid restrict pt  -hold treatment plan pending labs/imaging  -avoid >8-12 mEq change over next  12-24 hours.   2-hypokalemia -replete  -mag level   3- HTN -BP LLN  -hold BP regimen   4- Hiccups  -unclear etiology currently  -CT chest pending  -PPI   5- Strep throat  -rapid strep negative -strep cx -resp virus panel  -follow  FEN/GI: Heart healthy, 1200 mL fluid diet  Prophylaxis: sub q heparin  Disposition: pending further evaluation  Code Status:Full Code    Patient Active Problem List   Diagnosis Date Noted  . Hyponatremia 09/07/2014  . 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 High frequency hearing loss 03/09/2014   Past Medical History: Past Medical History  Diagnosis Date  . Hypertension   . Hearing loss     Past Surgical History: History reviewed. No pertinent past surgical history.  Social History: History   Social History  . Marital Status: Married    Spouse Name: N/A    Number of Children: N/A  . Years of Education: N/A   Social History Main Topics  . Smoking status: Never Smoker   . Smokeless tobacco: None  . Alcohol Use: Yes     Comment: social  . Drug Use: No  . Sexual Activity: None   Other Topics Concern  . None   Social History Narrative    Family History: History reviewed. No pertinent family history.  Allergies: No Known Allergies  Current Facility-Administered Medications  Medication Dose Route Frequency Provider Last Rate Last Dose  . 0.9 %  sodium chloride infusion  250 mL Intravenous PRN Doree Albee, MD      . heparin injection 5,000 Units  5,000 Units Subcutaneous 3 times per day Doree Albee, MD      .  sodium chloride 0.9 % injection 3 mL  3 mL Intravenous Q12H Doree AlbeeSteven Jakari Jacot, MD   3 mL at 09/07/14 0218  . sodium chloride 0.9 % injection 3 mL  3 mL Intravenous PRN Doree AlbeeSteven Kerrick Miler, MD       Current Outpatient Prescriptions  Medication Sig Dispense Refill  . amoxicillin (AMOXIL) 500 MG capsule Take 1,000 mg by mouth 2 (two) times daily. For 7 days    . aspirin-sod bicarb-citric acid (ALKA-SELTZER)  325 MG TBEF tablet Take 325 mg by mouth every 6 (six) hours as needed (flu like symptoms).    Marland Kitchen. atorvastatin (LIPITOR) 20 MG tablet Take 1 tablet (20 mg total) by mouth at bedtime. Please call if you have constant muscle aches or pains 30 tablet 5  . chlorthalidone (HYGROTON) 25 MG tablet Take 0.5 tablets (12.5 mg total) by mouth daily. Blood pressure goal is less than 150/90. 30 tablet 3  . promethazine (PHENERGAN) 25 MG tablet Take 25 mg by mouth every 6 (six) hours as needed for nausea or vomiting (hiccups).    . fluticasone (FLONASE) 50 MCG/ACT nasal spray Place 2 sprays into both nostrils daily. (Patient not taking: Reported on 09/06/2014) 16 g 6   Review Of Systems: 12 point ROS negative except as noted above in HPI.  Physical Exam: Filed Vitals:   09/07/14 0117  BP: 94/48  Pulse: 83  Temp: 98 F (36.7 C)  Resp: 12    General: cooperative and somnolent but arousable  HEENT: PERRLA and extra ocular movement intact Heart: S1, S2 normal, no murmur, rub or gallop, regular rate and rhythm Lungs: clear to auscultation, no wheezes or rales and unlabored breathing Abdomen: abdomen is soft without significant tenderness, masses, organomegaly or guarding Extremities: extremities normal, atraumatic, no cyanosis or edema Skin:no rashes Neurology: normal without focal findings  Labs and Imaging: Lab Results  Component Value Date/Time   NA 121* 09/06/2014 10:00 PM   K 3.4* 09/06/2014 10:00 PM   CL 82* 09/06/2014 10:00 PM   CO2 31 09/06/2014 10:00 PM   BUN 9 09/06/2014 10:00 PM   CREATININE 0.79 09/06/2014 10:00 PM   CREATININE 0.97 03/10/2014 09:36 AM   GLUCOSE 113* 09/06/2014 10:00 PM   Lab Results  Component Value Date   WBC 8.7 09/06/2014   HGB 13.5 09/06/2014   HCT 41.7 09/06/2014   MCV 77.5* 09/06/2014   PLT 285 09/06/2014   Urinalysis No results found for: COLORURINE, APPEARANCEUR, LABSPEC, PHURINE, GLUCOSEU, HGBUR, BILIRUBINUR, KETONESUR, PROTEINUR, UROBILINOGEN, NITRITE,  LEUKOCYTESUR    Dg Chest 2 View  09/07/2014   CLINICAL DATA:  Sore throat, intermittent fever, shortness of breath and hiccups for 1 week.  EXAM: CHEST  2 VIEW  COMPARISON:  Chest radiograph December 18, 2009  FINDINGS: Cardiac silhouette is mildly enlarged, mediastinal silhouette is nonsuspicious. Low inspiratory examination with bibasilar patchy airspace opacities, no pleural effusion. No pneumothorax. Soft tissue planes and included osseous structures are nonsuspicious.  IMPRESSION: Patchy bibasilar airspace opacities may reflect atelectasis, less likely pneumonia in this low inspiratory examination.  Mild cardiomegaly.   Electronically Signed   By: Awilda Metroourtnay  Bloomer   On: 09/07/2014 01:21           Doree AlbeeSteven Dennisse Swader MD  Pager: [phone]

## 2014-09-07 NOTE — Progress Notes (Addendum)
Patient ID: Todd Luna, male   DOB: [birth year], 64 y.o.   MRN: [id number] [facility] PROGRESS NOTE  Daylyn Azbill WUJ:[id number] DOB: 1951-05-18 DOA: 09/06/2014 PCP: Rockne Coons, DO  Brief narrative:  Addendum to admission note done 09/07/2014 64 year old male with past medical history of hypertension who presented to [facility] ED with ongoing hiccups. He was recently started on amoxicillin for treatment of possible strep. Patient apparently has had worsening hiccups over past few days. On this admission, patient was found to have hyponatremia of 121 and possible pneumonia.   Assessment and Plan:    Active Problems: Hiccups - unclear etiology at this time  - will try baclofen BID and see if it helps  Community acquired pneumonia - will change from amoxicillin to Zithromax and Rocephin - pt has had exposure to young family member at home with strep throat - will obtain strep pneumo and urine legionella - provide BD's scheduled and as needed  Small pulmonary nodule  - on the right side - d/w pt and family, scans reviewed with family in detail - pt is not smoker, recommend repeat CT chest in one year to ensure resolution   Hypokalemia - mild, supplemented and WNL this AM  Hyponatremia - likely pre renal in etiology - IVF provided and Na is trending up    DVT prophylaxis  HeparinSQ while pt is in hospital  Code Status: Full Family Communication: Pt and family at bedside Disposition Plan: Home in AM   IV Access:   Peripheral IV  Procedures and diagnostic studies:   Dg Chest 2 View 09/07/2014    Patchy bibasilar airspace opacities may reflect atelectasis, less likely pneumonia in this low inspiratory examination.  Mild cardiomegaly.    Ct Chest Wo Contrast 09/07/2014    Infiltration or atelectasis in the lung bases bilaterally. Findings could represent pneumonia given the clinical presentation. Coronary artery calcifications. Multiple small nodules demonstrated in the right lung  base, largest measuring 4 mm. See above recommendation.     Medical Consultants:   None   Other Consultants:   None   Anti-Infectives:   Zithromax 1/6 --> Rocephin 1/6 -->  Debbora Presto, MD  [facility] Pager [phone]  If 7PM-7AM, please contact night-coverage [email or web address] Password [email or web address] 09/07/2014, 8:52 AM   LOS: 1 day   HPI/Subjective: No events overnight.   Objective: Filed Vitals:   09/07/14 0030 09/07/14 0117 09/07/14 0359 09/07/14 0613  BP: 130/69 94/48 124/63 131/74  Pulse:  83 86 85  Temp:  98 F (36.7 C) 98.2 F (36.8 C) 98.4 F (36.9 C)  TempSrc:  Oral Oral Oral  Resp:  Height:    (1.651 m)   Weight:   74.481 kg (164 lb 3.2 oz)   SpO2:  92% 97% 96%    Intake/Output Summary (Last 24 hours) at 09/07/14 8657 Last data filed at 09/07/14 0210  Gross per 24 hour  Intake      0 ml  Output    400 ml  Net   -400 ml    Exam:   General:  Pt is  not in acute distress  Cardiovascular: Regular rate and rhythm, S1/S2 (+)  Respiratory:  no wheezing, no crackles, mild rhonchi at bases   Abdomen: Soft, non tender, non distended, bowel sounds present, no guarding  Extremities: No edema, pulses DP and PT palpable bilaterally  Neuro: Grossly nonfocal  Data Reviewed: Basic Metabolic Panel:  Recent Labs Lab 09/06/14 2200 09/07/14 0515  NA 121* 127*  K 3.4* 3.6  CL 82* 90*  CO2 31 27  GLUCOSE 113* 116*  BUN 9 10  CREATININE 0.79 0.77  CALCIUM 8.8 8.6  MG  --  2.0   Liver Function Tests:  Recent Labs Lab 09/06/14 2200 09/07/14 0515  AST 68* 53*  ALT 47 40  ALKPHOS 74 63  BILITOT 0.6 0.7  PROT 7.6 6.5  ALBUMIN 4.0 3.5    Recent Labs Lab 09/06/14 2200  LIPASE 27   CBC:  Recent Labs Lab 09/06/14 2200 09/07/14 0515  WBC 8.7 8.7  NEUTROABS  --  5.9  HGB 13.5 13.2  HCT 41.7 38.8*  MCV 77.5* 76.5*  PLT 285 247   Cardiac Enzymes:  Recent Labs Lab 09/06/14 2200  TROPONINI <0.03    Recent Results (from the  past 240 hour(s))  Rapid strep screen     Status: None   Collection Time: 09/07/14  1:15 AM  Result Value Ref Range Status   Streptococcus, Group A Screen (Direct) NEGATIVE NEGATIVE Final     Scheduled Meds: . amoxicillin  1,000 mg Oral BID  . heparin  5,000 Units Subcutaneous 3 times per day  . [START ON 09/08/2014] pneumococcal 23 valent vaccine  0.5 mL Intramuscular Tomorrow-1000  . sodium chloride  3 mL Intravenous Q12H

## 2014-09-07 NOTE — Progress Notes (Signed)
ANTIBIOTIC CONSULT NOTE - INITIAL  Pharmacy Consult for Levaquin Indication: CAP  No Known Allergies  Patient Measurements: Height: 5\' 5"  (165.1 cm) Weight: 164 lb 3.2 oz (74.481 kg) IBW/kg (Calculated) : 61.5 Adjusted Body Weight:   Vital Signs: Temp: 98.4 F (36.9 C) (01/06 0613) Temp Source: Oral (01/06 16100613) BP: 131/74 mmHg (01/06 96040613) Pulse Rate: 85 (01/06 0613) Intake/Output from previous day: 01/05 0701 - 01/06 0700 In: -  Out: 400 [Urine:400] Intake/Output from this shift:    Labs:  Recent Labs  09/06/14 2200 09/07/14 0515  WBC 8.7 8.7  HGB 13.5 13.2  PLT 285 247  CREATININE 0.79 0.77   Estimated Creatinine Clearance: 85.7 mL/min (by C-G formula based on Cr of 0.77). No results for input(s): VANCOTROUGH, VANCOPEAK, VANCORANDOM, GENTTROUGH, GENTPEAK, GENTRANDOM, TOBRATROUGH, TOBRAPEAK, TOBRARND, AMIKACINPEAK, AMIKACINTROU, AMIKACIN in the last 72 hours.   Microbiology: Recent Results (from the past 720 hour(s))  Rapid strep screen     Status: None   Collection Time: 09/07/14  1:15 AM  Result Value Ref Range Status   Streptococcus, Group A Screen (Direct) NEGATIVE NEGATIVE Final    Comment: (NOTE) A Rapid Antigen test may result negative if the antigen level in the sample is below the detection level of this test. The FDA has not cleared this test as a stand-alone test therefore the rapid antigen negative result has reflexed to a Group A Strep culture.     Medical History: Past Medical History  Diagnosis Date  . Hypertension   . Hearing loss     Assessment: [age] yoM presents with hyponatremia and hiccups. CXR shows patchy bibasilar airspace opacities which may reflect atelectasis and less likely pneumonia. Pt was taking amoxicillin outpatient and pharmacy consulted to start levaquin inpatient for CAP.    Outpatient -amoxicillin 1/2/6-->1/5/6 1/6 >> levaquin >>    Tmax: AF WBCs: WNL Renal: SCr 0.77, CrCl 86 m/min  1/6 GAS - collected 1/6  rapid strep screen - negative  Goal of Therapy:  Eradication of infection  Plan:  Levaquin 750mg  IV q24h.    Please reconsult if further dosing recommendations needed.  Thank you for the consult!  Haynes Hoehnolleen Dravon Nott, PharmD, BCPS 09/07/2014, 9:50 AM  Telephone - [phone]

## 2014-09-07 NOTE — Progress Notes (Signed)
  Echocardiogram 2D Echocardiogram has been performed.  Arvil ChacoFoster, Geraldo Haris 09/07/2014, 10:01 AM

## 2014-09-07 NOTE — Progress Notes (Signed)
UR completed 

## 2014-09-07 NOTE — ED Notes (Signed)
Patient currently has hiccups.  He doesn't speak [place].  Son-in-law and daughter are at bedside acting as interpretors.  He denies pain or needs at present time.

## 2014-09-07 NOTE — Progress Notes (Signed)
Patient is complaining of hiccups. Dr. Izola PriceMyers notified for prn medication.

## 2014-09-08 LAB — COMPREHENSIVE METABOLIC PANEL
ALBUMIN: 3.6 g/dL (ref 3.5–5.2)
ALT: 39 U/L (ref 0–53)
ANION GAP: 4 — AB (ref 5–15)
AST: 39 U/L — ABNORMAL HIGH (ref 0–37)
Alkaline Phosphatase: 70 U/L (ref 39–117)
BILIRUBIN TOTAL: 0.7 mg/dL (ref 0.3–1.2)
BUN: 10 mg/dL (ref 6–23)
CO2: 28 mmol/L (ref 19–32)
Calcium: 8.7 mg/dL (ref 8.4–10.5)
Chloride: 100 mEq/L (ref 96–112)
Creatinine, Ser: 0.86 mg/dL (ref 0.50–1.35)
GFR calc Af Amer: >90 mL/min (ref >90)
GFR calc non Af Amer: 88 mL/min — ABNORMAL LOW (ref >90)
GLUCOSE: 111 mg/dL — AB (ref 70–99)
Potassium: 3.8 mmol/L (ref 3.5–5.1)
Sodium: 132 mmol/L — ABNORMAL LOW (ref 135–145)
TOTAL PROTEIN: 7 g/dL (ref 6.0–8.3)

## 2014-09-08 LAB — CBC WITH DIFFERENTIAL/PLATELET
Basophils Absolute: 0 10*3/uL (ref 0.0–0.1)
Basophils Relative: 1 % (ref 0–1)
EOS PCT: 2 % (ref 0–5)
Eosinophils Absolute: 0.1 10*3/uL (ref 0.0–0.7)
HEMATOCRIT: 38.2 % — AB (ref 39.0–52.0)
HEMOGLOBIN: 12.6 g/dL — AB (ref 13.0–17.0)
LYMPHS ABS: 1.9 10*3/uL (ref 0.7–4.0)
LYMPHS PCT: 23 % (ref 12–46)
MCH: 25.9 pg — ABNORMAL LOW (ref 26.0–34.0)
MCHC: 33 g/dL (ref 30.0–36.0)
MCV: 78.4 fL (ref 78.0–100.0)
Monocytes Absolute: 0.7 10*3/uL (ref 0.1–1.0)
Monocytes Relative: 8 % (ref 3–12)
NEUTROS ABS: 5.4 10*3/uL (ref 1.7–7.7)
Neutrophils Relative %: 66 % (ref 43–77)
PLATELETS: 274 10*3/uL (ref 150–400)
RBC: 4.87 MIL/uL (ref 4.22–5.81)
RDW: 12.3 % (ref 11.5–15.5)
WBC: 8.1 10*3/uL (ref 4.0–10.5)

## 2014-09-08 LAB — CULTURE, GROUP A STREP

## 2014-09-08 MED ORDER — CHLORPROMAZINE HCL 50 MG PO TABS: 50.0000 mg | ORAL_TABLET | Freq: Four times a day (QID) | ORAL | Status: DC | PRN

## 2014-09-08 MED ORDER — AZITHROMYCIN 250 MG PO TABS: 250.0000 mg | ORAL_TABLET | Freq: Every day | ORAL | Status: DC

## 2014-09-08 NOTE — Discharge Instructions (Signed)
Viêm ph?i °(Pneumonia) °Viêm ph?i là b?nh nhi?m trùng ph?i.  °NGUYÊN NHÂN °Viêm ph?i có th? do vi khu?n ho?c vi rút gây ra. Thông th??ng, các nhi?m trùng này x?y ra do hít các h?t mang m?m b?nh vào ph?i (???ng hô h?p). °D?U HI?U VÀ TRI?U CH?NG  °· Ho. °· S?t. °· ?au ng?c. °· Nh?p th? t?ng lên. °· Th? khò khè. °· S?n sinh d?ch nh?y. °CH?N ?OÁN  °N?u quý v? có các tri?u ch?ng ph? bi?n c?a b?nh viêm ph?i, chuyên gia ch?m sóc s?c kh?e c?a quý v? th??ng s? xác nh?n ch?n ?oán b?ng X-quang ph?i. X-quang s? hi?n th? ph?n b?t th??ng trong ph?i (thâm nhi?m ph?i) n?u quý v? b? viêm ph?i. Các xét nghi?m máu, n??c ti?u ho?c ??m khác có th? ???c th?c hi?n ?? tìm ra nguyên nhân c? th? gây b?nh viêm ph?i c?a quý v?. Chuyên gia ch?m sóc s?c kh?e c?a quý v? có th? c?ng làm các xét nghi?m (khí máu ho?c ?o ?? bão hòa oxy trong máu) ?? xem ph?i c?a quý v? ?ang ho?t ??ng nh? th? nào. °?I?U TR?  °M?t s? d?ng viêm ph?i có th? lây lan sang ng??i khác khi quý v? ho ho?c h?t h?i. Quý v? có th? ???c yêu c?u ?eo kh?u trang tr??c và trong quá trình khám. Viêm ph?i do vi khu?n gây ra ???c ?i?u tr? b?ng thu?c kháng sinh. Viêm ph?i do vi rút cúm gây ra có th? ???c ?i?u tr? b?ng thu?c kháng vi rút. H?u h?t các b?nh nhi?m trùng do vi rút khác ph?i ?i h?t ti?n trình c?a b?nh. Các b?nh nhi?m trùng này s? không ?áp ?ng v?i thu?c kháng sinh.  °H??NG D?N CH?M SÓC T?I NHÀ  °· Thu?c ho có th? ???c s? d?ng n?u quý v? không ???c ngh? ng?i nhi?u. Tuy nhiên, ho b?o v? quý v? b?ng cách làm s?ch ph?i. Quý v? nên tránh s? d?ng thu?c ho n?u có th?. °· Chuyên gia ch?m sóc s?c kh?e c?a quý v? có th? ?ã kê ??n thu?c n?u ngh? r?ng quý v? b? viêm ph?i do vi khu?n ho?c do cúm. Hãy dùng h?t thu?c ngay c? khi quý v? b?t ??u c?m th?y khá h?n. °· Chuyên gia ch?m sóc s?c kh?e c?ng có th? kê ??n thu?c long ??m. Thu?c này làm l?ng d?ch nh?y ?? ho ra ngoài. °· Ch? s? d?ng thu?c theo ch? d?n c?a chuyên gia ch?m sóc s?c kh?e. °· Không hút thu?c. Hút thu?c lá là nguyên nhân ph?  bi?n gây viêm ph? qu?n và có th? góp ph?n gây viêm ph?i. N?u quý v? hút thu?c và ti?p t?c hút thu?c, ch?ng ho c?a quý v? có th? kéo dài vài tu?n sau khi h?t viêm ph?i. °· Máy phun h?i n??c mát ho?c máy t?o h?i ?m trong phòng ho?c trong nhà quý v? có th? giúp làm l?ng d?ch nh?y. °· Ho th??ng n?ng h?n vào ban ?êm. Ng? ? t? th? n?a n?m n?a ng?i trong m?t chi?c gh? t?a ho?c s? d?ng m?t ?ôi g?i d??i ??u s? giúp làm d?u b?t v?n ?? này. °· Ngh? ng?i khi quý v? c?m th?y c?n. C? th? c?a quý v? th??ng s? cho quý v? bi?t khi nào c?n ngh? ng?i. °PHÒNG NG?A °Có th? tiêm (v?c xin) ph? c?u khu?n ?? tránh vi khu?n thông th??ng gây viêm ph?i. Tiêm v?c xin th??ng ???c ?? ngh? cho: °· Nh?ng ng??i trên 65 tu?i. °· B?nh nhân ?ang dùng hóa tr?   li?u. °· Nh?ng ng??i có v?n ?? v? ph?i m?n tính, ch?ng h?n nh? viêm ti?u ph? qu?n ho?c khí ph? th?ng. °· Nh?ng ng??i có v?n ?? v? h? th?ng mi?n d?ch. °N?u quý v? trên 65 tu?i ho?c có tình tr?ng nguy c? cao, quý v? có th? ???c tiêm v?cxin ph? c?u khu?n n?u quý v? ch?a ???c tiêm tr??c ?ó. ? m?t s? n??c, v?cxin cúm thông th??ng c?ng ???c ?? ngh?. V?cxin này có th? giúp phòng ng?a m?t s? tr??ng h?p viêm ph?i. Quý v? có th? s? ???c cung c?p v?cxin cúm nh? là m?t ph?n c?a d?ch v? ch?m sóc. °N?u quý v? hút thu?c, ?ã t?i lúc b? thu?c. Quý v? có th? ???c h??ng d?n v? cách d?ng hút thu?c. Chuyên gia ch?m sóc s?c kh?e c?a quý v? có th? cung c?p thu?c và t? v?n ?? giúp quý v? b? thu?c lá. °?I KHÁM N?U: °Quý v? b? s?t. °NGAY L?P T?C ?I KHÁM N?U:  °· B?nh c?a quý v? tr? nên t? h?n. ?i?u này ??c bi?t ?úng n?u quý v? cao tu?i ho?c ?ã b? y?u do b?t k? b?nh nào khác. °· Quý v? không th? ki?m soát c?n ho c?a mình b?ng thu?c ho và ?ang b? m?t ng?. °· Quý v? b?t ??u ho ra máu. °· Quý v? b? ?au và tr? nên n?ng h?n ho?c không ki?m soát ???c b?ng thu?c. °· B?t k? tri?u ch?ng nào ban ??u khi?n quý v? ph?i ??n ?i?u tr? tr? nên nghiêm tr?ng h?n thay vì t?t h?n. °· Quý v? b? khó th? ho?c ?au ng?c. °??M B?O QUÝ V?:  °· Hi?u rõ  các h??ng d?n này. °· S? theo dõi tình tr?ng c?a mình. °· S? yêu c?u tr? giúp ngay l?p t?c n?u quý v? c?m th?y không kh?e ho?c th?y tr?m tr?ng h?n. °Document Released: 08/19/2005 Document Revised: 01/03/2014 °ExitCare® Patient Information ©2015 [employer]. This information is not intended to replace advice given to you by your health care provider. Make sure you discuss any questions you have with your health care provider. ° °

## 2014-09-08 NOTE — Discharge Summary (Signed)
Physician Discharge Summary  Todd Luna WUJ:[id number] DOB: [birth year] DOA: 09/06/2014  PCP: Rockne Coons, DO  Admit date: 09/06/2014 Discharge date: 09/08/2014  Recommendations for Outpatient Follow-up:  1. Pt will need to follow up with PCP in 2-3 weeks post discharge 2. Please obtain BMP to evaluate electrolytes and kidney function 3. Please also check CBC to evaluate Hg and Hct levels 4. Please note that statin was stopped due to muscle pain and elevated liver enzymes 5. Pt also did not want to take BP medications due to muscle aches 6. Pt to complete Zithromax for 7 more days post discharge 7. Thorazine started for hiccups  8. Please note that we recommend repeat CT chest in one year to ensure resolution of the pulmonary nodule noted below on Ct chest   Discharge Diagnoses:  Active Problems:   Hyponatremia   Intractable hiccups   Discharge Condition: Stable  Diet recommendation: Heart healthy diet discussed in details   Brief narrative: 64 year old male with past medical history of hypertension who presented to [facility] ED with ongoing hiccups. He was recently started on amoxicillin for treatment of possible strep. Patient apparently has had worsening hiccups over past few days. On this admission, patient was found to have hyponatremia of 121 and possible pneumonia.   Assessment and Plan:    Active Problems: Hiccups - unclear etiology at this time  - thorazine started and now better   Community acquired pneumonia - pt stable this AM with oxygen saturations at target range on RA, wants to go home  - pt has had exposure to young family member at home with strep throat - continue Zithromax upon discharge for 7 more days   Small pulmonary nodule  - on the right side - d/w pt and family, scans reviewed with family in detail - pt is not smoker, recommend repeat CT chest in one year to ensure resolution   Hypokalemia - mild, supplemented and WNL this AM  Hyponatremia -  likely pre renal in etiology - responded well to IVF   DVT prophylaxis  HeparinSQ while pt is in hospital  Code Status: Full Family Communication: Pt and family at bedside Disposition Plan: Home   IV Access:    Peripheral IV  Procedures and diagnostic studies:   Dg Chest 2 View 09/07/2014 Patchy bibasilar airspace opacities may reflect atelectasis, less likely pneumonia in this low inspiratory examination. Mild cardiomegaly.   Ct Chest Wo Contrast 09/07/2014 Infiltration or atelectasis in the lung bases bilaterally. Findings could represent pneumonia given the clinical presentation. Coronary artery calcifications. Multiple small nodules demonstrated in the right lung base, largest measuring 4 mm. See above recommendation.   Medical Consultants:    None  Other Consultants:    None  Anti-Infectives:    Zithromax 1/6 -->   Rocephin 1/6 --> 1/7   Discharge Exam: Filed Vitals:   09/08/14 0605  BP: 129/76  Pulse: 92  Temp: 98.1 F (36.7 C)  Resp: 18   Filed Vitals:   09/07/14 1354 09/07/14 1953 09/07/14 2053 09/08/14 0605  BP: 132/72  128/56 129/76  Pulse: 91  98 92  Temp: 97.3 F (36.3 C)  98.4 F (36.9 C) 98.1 F (36.7 C)  TempSrc: Oral  Oral Oral  Resp: Height:      Weight:      SpO2: 97% 96% 96% 97%    General: Pt is alert, follows commands appropriately, not in acute distress Cardiovascular: Regular rate and rhythm,  S1/S2 +, no murmurs, no rubs, no gallops Respiratory: Clear to auscultation bilaterally, no wheezing, no crackles, no rhonchi Abdominal: Soft, non tender, non distended, bowel sounds +, no guarding  Discharge Instructions  Discharge Instructions    Diet - low sodium heart healthy    Complete by:  As directed      Increase activity slowly    Complete by:  As directed             Medication List    STOP taking these medications        amoxicillin 500 MG capsule  Commonly known as:  AMOXIL      atorvastatin 20 MG tablet  Commonly known as:  LIPITOR     chlorthalidone 25 MG tablet  Commonly known as:  HYGROTON     promethazine 25 MG tablet  Commonly known as:  PHENERGAN      TAKE these medications        aspirin-sod bicarb-citric acid 325 MG Tbef tablet  Commonly known as:  ALKA-SELTZER  Take 325 mg by mouth every 6 (six) hours as needed (flu like symptoms).     azithromycin 250 MG tablet  Commonly known as:  ZITHROMAX  Take 1 tablet (250 mg total) by mouth daily.     chlorproMAZINE 50 MG tablet  Commonly known as:  THORAZINE  Take 1 tablet (50 mg total) by mouth 4 (four) times daily as needed for hiccoughs.     fluticasone 50 MCG/ACT nasal spray  Commonly known as:  FLONASE  Place 2 sprays into both nostrils daily.           Follow-up Information    Follow up with LE, THAO PHUONG, DO.   Specialty:  Family Medicine   Contact information:   [address] [place] [address] [phone]       Follow up with Christia ReadingBATES, DWIGHT, MD.   Specialty:  Otolaryngology   Contact information:   [address] [place] [address] [phone]        The results of significant diagnostics from this hospitalization (including imaging, microbiology, ancillary and laboratory) are listed below for reference.     Microbiology: Recent Results (from the past 240 hour(s))  Rapid strep screen     Status: None   Collection Time: 09/07/14  1:15 AM  Result Value Ref Range Status   Streptococcus, Group A Screen (Direct) NEGATIVE NEGATIVE Final    Comment: (NOTE) A Rapid Antigen test may result negative if the antigen level in the sample is below the detection level of this test. The FDA has not cleared this test as a stand-alone test therefore the rapid antigen negative result has reflexed to a Group A Strep culture.      Labs: Basic Metabolic Panel:  Recent Labs Lab 09/06/14 2200 09/07/14 0515 09/08/14 0625  NA 121* 127* 132*  K 3.4* 3.6 3.8   CL 82* 90* 100  CO2 31 27 28   GLUCOSE 113* 116* 111*  BUN 9 10 10   CREATININE 0.79 0.77 0.86  CALCIUM 8.8 8.6 8.7  MG  --  2.0  --    Liver Function Tests:  Recent Labs Lab 09/06/14 2200 09/07/14 0515 09/08/14 0625  AST 68* 53* 39*  ALT 47 40 39  ALKPHOS 74 63 70  BILITOT 0.6 0.7 0.7  PROT 7.6 6.5 7.0  ALBUMIN 4.0 3.5 3.6    Recent Labs Lab 09/06/14 2200  LIPASE 27   No results for input(s):  AMMONIA in the last 168 hours. CBC:  Recent Labs Lab 09/06/14 2200 09/07/14 0515 09/08/14 0625  WBC 8.7 8.7 8.1  NEUTROABS  --  5.9 5.4  HGB 13.5 13.2 12.6*  HCT 41.7 38.8* 38.2*  MCV 77.5* 76.5* 78.4  PLT 285 247 274   Cardiac Enzymes:  Recent Labs Lab 09/06/14 2200  TROPONINI <0.03   BNP: BNP (last 3 results) No results for input(s): PROBNP in the last 8760 hours. CBG: No results for input(s): GLUCAP in the last 168 hours.   SIGNED: Time coordinating discharge: Over 30 minutes  Debbora Presto, MD  [facility] 09/08/2014, 10:02 AM Pager [phone]  If 7PM-7AM, please contact night-coverage [email or web address] Password [email or web address]

## 2014-09-08 NOTE — Progress Notes (Addendum)
DC instructions reviewed with patient and son. Home med rec carefully reviewed and discussed with patient what meds to take and when. 2Rxs given. Patient instructed to make sure that he takes the entire 7 day course of the antibiotic. Patient also instructed that thorazine was being given to him prn hiccups. IV DCed. No changes noted since am assessment. Patient to DC to home with son. Patient instructed to make sure that he has a f/u with his PCP and to notify them of his admit to the hospital. Patient and son denied further questions or concerns. Dr. Izola PriceMyers reported that ordered urine sample was not needed prior to DC home.

## 2014-09-30 ENCOUNTER — Ambulatory Visit: Payer: 59 | Admitting: Family Medicine

## 2015-04-03 ENCOUNTER — Other Ambulatory Visit: Payer: Self-pay | Admitting: Otolaryngology

## 2015-04-03 DIAGNOSIS — H905 Unspecified sensorineural hearing loss: Secondary | ICD-10-CM

## 2015-04-25 ENCOUNTER — Ambulatory Visit
Admission: RE | Admit: 2015-04-25 | Discharge: 2015-04-25 | Disposition: A | Discharge status: Home / Self Care | Payer: Medicaid Other | Source: Ambulatory Visit | Attending: Otolaryngology | Admitting: Otolaryngology

## 2015-04-25 DIAGNOSIS — H905 Unspecified sensorineural hearing loss: Secondary | ICD-10-CM

## 2015-04-25 MED ORDER — GADOBENATE DIMEGLUMINE 529 MG/ML IV SOLN
15.0000 mL | Freq: Once | INTRAVENOUS | Status: AC | PRN
  Administered 2015-04-25: 15 mL via INTRAVENOUS

## 2019-04-26 ENCOUNTER — Other Ambulatory Visit: Payer: Self-pay

## 2019-04-26 ENCOUNTER — Emergency Department (HOSPITAL_COMMUNITY)
Admission: EM | Admit: 2019-04-26 | Discharge: 2019-04-26 | Disposition: A | Discharge status: Home / Self Care | Payer: Medicare Other | Attending: Emergency Medicine | Admitting: Emergency Medicine

## 2019-04-26 ENCOUNTER — Encounter (HOSPITAL_COMMUNITY): Payer: Self-pay | Admitting: Emergency Medicine

## 2019-04-26 DIAGNOSIS — R110 Nausea: Secondary | ICD-10-CM

## 2019-04-26 DIAGNOSIS — R112 Nausea with vomiting, unspecified: Secondary | ICD-10-CM | POA: Diagnosis present

## 2019-04-26 DIAGNOSIS — I10 Essential (primary) hypertension: Secondary | ICD-10-CM | POA: Diagnosis not present

## 2019-04-26 HISTORY — DX: Pure hypercholesterolemia, unspecified: E78.00

## 2019-04-26 LAB — COMPREHENSIVE METABOLIC PANEL
ALT: 27 U/L (ref 0–44)
AST: 26 U/L (ref 15–41)
Albumin: 4.3 g/dL (ref 3.5–5.0)
Alkaline Phosphatase: 63 U/L (ref 38–126)
Anion gap: 12 (ref 5–15)
BUN: 11 mg/dL (ref 8–23)
CO2: 24 mmol/L (ref 22–32)
Calcium: 9.3 mg/dL (ref 8.9–10.3)
Chloride: 99 mmol/L (ref 98–111)
Creatinine, Ser: 0.97 mg/dL (ref 0.61–1.24)
GFR calc Af Amer: >60 mL/min (ref >60)
GFR calc non Af Amer: >60 mL/min (ref >60)
Glucose, Bld: 142 mg/dL — ABNORMAL HIGH (ref 70–99)
Potassium: 4.1 mmol/L (ref 3.5–5.1)
Sodium: 135 mmol/L (ref 135–145)
Total Bilirubin: 1.1 mg/dL (ref 0.3–1.2)
Total Protein: 7.5 g/dL (ref 6.5–8.1)

## 2019-04-26 LAB — CBC WITH DIFFERENTIAL/PLATELET
Abs Immature Granulocytes: 0.03 10*3/uL (ref 0.00–0.07)
Basophils Absolute: 0 10*3/uL (ref 0.0–0.1)
Basophils Relative: 0 %
Eosinophils Absolute: 0 10*3/uL (ref 0.0–0.5)
Eosinophils Relative: 0 %
HCT: 42.2 % (ref 39.0–52.0)
Hemoglobin: 13.9 g/dL (ref 13.0–17.0)
Immature Granulocytes: 0 %
Lymphocytes Relative: 4 %
Lymphs Abs: 0.3 10*3/uL — ABNORMAL LOW (ref 0.7–4.0)
MCH: 27 pg (ref 26.0–34.0)
MCHC: 32.9 g/dL (ref 30.0–36.0)
MCV: 81.9 fL (ref 80.0–100.0)
Monocytes Absolute: 0.3 10*3/uL (ref 0.1–1.0)
Monocytes Relative: 4 %
Neutro Abs: 7.3 10*3/uL (ref 1.7–7.7)
Neutrophils Relative %: 92 %
Platelets: 243 10*3/uL (ref 150–400)
RBC: 5.15 MIL/uL (ref 4.22–5.81)
RDW: 12.6 % (ref 11.5–15.5)
WBC: 8 10*3/uL (ref 4.0–10.5)
nRBC: 0 % (ref 0.0–0.2)

## 2019-04-26 LAB — LIPASE, BLOOD: Lipase: 26 U/L (ref 11–51)

## 2019-04-26 MED ORDER — FAMOTIDINE 20 MG PO TABS: 20.0000 mg | ORAL_TABLET | Freq: Two times a day (BID) | ORAL | 0 refills | Status: AC

## 2019-04-26 MED ORDER — ONDANSETRON HCL 4 MG/2ML IJ SOLN
4.0000 mg | Freq: Once | INTRAMUSCULAR | Status: AC
  Administered 2019-04-26: 4 mg via INTRAVENOUS
  Filled 2019-04-26: qty 2

## 2019-04-26 MED ORDER — SODIUM CHLORIDE 0.9 % IV BOLUS
500.0000 mL | Freq: Once | INTRAVENOUS | Status: AC
Start: 2019-04-26 — End: 2019-04-26
  Administered 2019-04-26: 500 mL via INTRAVENOUS

## 2019-04-26 MED ORDER — FAMOTIDINE IN NACL 20-0.9 MG/50ML-% IV SOLN
20.0000 mg | Freq: Once | INTRAVENOUS | Status: AC
  Administered 2019-04-26: 15:00:00 20 mg via INTRAVENOUS
  Filled 2019-04-26: qty 50

## 2019-04-26 MED ORDER — ONDANSETRON HCL 4 MG PO TABS: 4.0000 mg | ORAL_TABLET | Freq: Four times a day (QID) | ORAL | 0 refills | Status: AC

## 2019-04-26 NOTE — Discharge Instructions (Signed)
Please return for any problem.  Follow-up with your regular care provider tomorrow. °

## 2019-04-26 NOTE — ED Provider Notes (Signed)
[facility]-EMERGENCY DEPT Provider Note   CSN: [id number] Arrival date & time: 04/26/19  1356     History   Chief Complaint Chief Complaint  Patient presents with  . Gastroesophageal Reflux    HPI Todd Luna is a 68 y.o. male.     68 year old male [place] speaking, [place] speaking, with medical history as detailed below presents for evaluation of nausea, vomiting, and epigastric discomfort.  Patient reports onset of symptoms approximately 1 week ago.  Patient denies associated fever.  He reports multiple episodes of emesis at home.  No bloody emesis noted.  No change in his bowel movements reported.  Patient denies associated chest pain or shortness of breath.  The history is provided by the patient and medical records. A language interpreter was used.  Gastroesophageal Reflux This is a new problem. The current episode started more than 2 days ago. The problem occurs constantly. The problem has not changed since onset.Associated symptoms include abdominal pain. Pertinent negatives include no chest pain, no headaches and no shortness of breath. Nothing aggravates the symptoms. Nothing relieves the symptoms.    Past Medical History:  Diagnosis Date  . Hearing loss   . High cholesterol   . Hypertension     Patient Active Problem List   Diagnosis Date Noted  . Hyponatremia 09/07/2014  . Intractable hiccups   . 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 High frequency hearing loss 03/09/2014    History reviewed. No pertinent surgical history.      Home Medications    Prior to Admission medications   Medication Sig Start Date End Date Taking? Authorizing Provider  aspirin-sod bicarb-citric acid (ALKA-SELTZER) 325 MG TBEF tablet Take 325 mg by mouth every 6 (six) hours as needed (flu like symptoms).    [provider]  azithromycin (ZITHROMAX) 250 MG tablet Take 1 tablet (250 mg total) by mouth daily. 09/08/14   Dorothea OgleMyers, Iskra M,  MD  chlorproMAZINE (THORAZINE) 50 MG tablet Take 1 tablet (50 mg total) by mouth 4 (four) times daily as needed for hiccoughs. 09/08/14   Dorothea OgleMyers, Iskra M, MD  fluticasone [facility](FLONASE) 50 MCG/ACT nasal spray Place 2 sprays into both nostrils daily. Patient not taking: Reported on 09/06/2014 12/27/13   Lenell AntuLe, Thao P, DO    Family History No family history on file.  Social History Social History   Tobacco Use  . Smoking status: Never Smoker  . Smokeless tobacco: Never Used  Substance Use Topics  . Alcohol use: Yes    Comment: social  . Drug use: No     Allergies   Patient has no known allergies.   Review of Systems Review of Systems  Respiratory: Negative for shortness of breath.   Cardiovascular: Negative for chest pain.  Gastrointestinal: Positive for abdominal pain.  Neurological: Negative for headaches.  All other systems reviewed and are negative.    Physical Exam Updated Vital Signs BP (!) 163/85 (BP Location: Left Arm)   Pulse 87   Temp 97.8 F (36.6 C) (Oral)   Resp 16   SpO2 97%   Physical Exam Vitals signs and nursing note reviewed.  Constitutional:      General: He is not in acute distress.    Appearance: Normal appearance. He is well-developed.  HENT:     Head: Normocephalic and atraumatic.  Eyes:     Conjunctiva/sclera: Conjunctivae normal.     Pupils: Pupils are equal, round, and reactive to light.  Neck:  Musculoskeletal: Normal range of motion and neck supple.  Cardiovascular:     Rate and Rhythm: Normal rate and regular rhythm.     Heart sounds: Normal heart sounds.  Pulmonary:     Effort: Pulmonary effort is normal. No respiratory distress.     Breath sounds: Normal breath sounds.  Abdominal:     General: There is no distension.     Palpations: Abdomen is soft.     Tenderness: There is no abdominal tenderness.  Musculoskeletal: Normal range of motion.        General: No deformity.  Skin:    General: Skin is warm and dry.  Neurological:      Mental Status: He is alert and oriented to person, place, and time.      ED Treatments / Results  Labs (all labs ordered are listed, but only abnormal results are displayed) Labs Reviewed  COMPREHENSIVE METABOLIC PANEL - Abnormal; Notable for the following components:      Result Value   Glucose, Bld 142 (*)    All other components within normal limits  CBC WITH DIFFERENTIAL/PLATELET - Abnormal; Notable for the following components:   Lymphs Abs 0.3 (*)    All other components within normal limits  LIPASE, BLOOD    EKG None  Radiology No results found.  Procedures Procedures (including critical care time)  Medications Ordered in ED Medications  famotidine (PEPCID) IVPB 20 mg premix (20 mg Intravenous New Bag/Given 04/26/19 1520)  sodium chloride 0.9 % bolus 500 mL (500 mLs Intravenous New Bag/Given 04/26/19 1520)  ondansetron (ZOFRAN) injection 4 mg (4 mg Intravenous Given 04/26/19 1520)     Initial Impression / Assessment and Plan / ED Course  I have reviewed the triage vital signs and the nursing notes.  Pertinent labs & imaging results that were available during my care of the patient were reviewed by me and considered in my medical decision making (see chart for details).        MDM  Screen complete  Todd Luna was evaluated in Emergency Department on 04/26/2019 for the symptoms described in the history of present illness. He was evaluated in the context of the global COVID-19 pandemic, which necessitated consideration that the patient might be at risk for infection with the SARS-CoV-2 virus that causes COVID-19. Institutional protocols and algorithms that pertain to the evaluation of patients at risk for COVID-19 are in a state of rapid change based on information released by regulatory bodies including the CDC and federal and state organizations. These policies and algorithms were followed during the patient's care in the ED.  Patient is presenting for evaluation  of nausea, vomiting, and symptoms of dyspepsia.  His evaluation he feels improved.  Screening labs obtained are without significant abnormality.  Patient desires discharge.  He is aware of need for close follow-up.  Strict return precautions given and understood.  All discharge instructions given with assistance of [place].   Final Clinical Impressions(s) / ED Diagnoses   Final diagnoses:  Nausea  Nausea and vomiting, intractability of vomiting not specified, unspecified vomiting type    ED Discharge Orders         Ordered    famotidine (PEPCID) 20 MG tablet  2 times daily     04/26/19 1656    ondansetron (ZOFRAN) 4 MG tablet  Every 6 hours     04/26/19 1656           Valarie Merino, MD 04/26/19 1657

## 2019-04-26 NOTE — ED Notes (Addendum)
Interpreter #[id number] used. Discharge instructions reviewed with patient. Patient verbalizes understanding. VSS.

## 2019-04-26 NOTE — ED Triage Notes (Signed)
[place] interpretor [place] [phone]. Pt states has Indigestion and full of gas or bloating for about week. Pt reports when he eats he gets gas build up and nausea, past few days hasnt vomited but feels tired from it. Pt states" last BM was this morning and had lots of farting and gas". Denies pain.

## 2019-04-29 ENCOUNTER — Other Ambulatory Visit: Payer: Self-pay

## 2019-04-29 ENCOUNTER — Encounter (HOSPITAL_COMMUNITY): Payer: Self-pay

## 2019-04-29 ENCOUNTER — Emergency Department (HOSPITAL_COMMUNITY)
Admission: EM | Admit: 2019-04-29 | Discharge: 2019-04-30 | Disposition: A | Discharge status: Home / Self Care | Payer: Medicare Other | Attending: Emergency Medicine | Admitting: Emergency Medicine

## 2019-04-29 DIAGNOSIS — R42 Dizziness and giddiness: Secondary | ICD-10-CM | POA: Diagnosis not present

## 2019-04-29 DIAGNOSIS — R109 Unspecified abdominal pain: Secondary | ICD-10-CM | POA: Diagnosis present

## 2019-04-29 DIAGNOSIS — K5900 Constipation, unspecified: Secondary | ICD-10-CM | POA: Insufficient documentation

## 2019-04-29 DIAGNOSIS — I10 Essential (primary) hypertension: Secondary | ICD-10-CM | POA: Diagnosis not present

## 2019-04-29 DIAGNOSIS — R112 Nausea with vomiting, unspecified: Secondary | ICD-10-CM | POA: Insufficient documentation

## 2019-04-29 LAB — CBC
HCT: 43.6 % (ref 39.0–52.0)
Hemoglobin: 14.3 g/dL (ref 13.0–17.0)
MCH: 27 pg (ref 26.0–34.0)
MCHC: 32.8 g/dL (ref 30.0–36.0)
MCV: 82.4 fL (ref 80.0–100.0)
Platelets: 241 10*3/uL (ref 150–400)
RBC: 5.29 MIL/uL (ref 4.22–5.81)
RDW: 12.7 % (ref 11.5–15.5)
WBC: 8.8 10*3/uL (ref 4.0–10.5)
nRBC: 0 % (ref 0.0–0.2)

## 2019-04-29 LAB — COMPREHENSIVE METABOLIC PANEL
ALT: 26 U/L (ref 0–44)
AST: 26 U/L (ref 15–41)
Albumin: 4.5 g/dL (ref 3.5–5.0)
Alkaline Phosphatase: 64 U/L (ref 38–126)
Anion gap: 13 (ref 5–15)
BUN: 10 mg/dL (ref 8–23)
CO2: 21 mmol/L — ABNORMAL LOW (ref 22–32)
Calcium: 9.2 mg/dL (ref 8.9–10.3)
Chloride: 95 mmol/L — ABNORMAL LOW (ref 98–111)
Creatinine, Ser: 1 mg/dL (ref 0.61–1.24)
GFR calc Af Amer: >60 mL/min (ref >60)
GFR calc non Af Amer: >60 mL/min (ref >60)
Glucose, Bld: 143 mg/dL — ABNORMAL HIGH (ref 70–99)
Potassium: 4.2 mmol/L (ref 3.5–5.1)
Sodium: 129 mmol/L — ABNORMAL LOW (ref 135–145)
Total Bilirubin: 1 mg/dL (ref 0.3–1.2)
Total Protein: 7.7 g/dL (ref 6.5–8.1)

## 2019-04-29 LAB — LIPASE, BLOOD: Lipase: 26 U/L (ref 11–51)

## 2019-04-29 MED ORDER — PROCHLORPERAZINE EDISYLATE 10 MG/2ML IJ SOLN
10.0000 mg | Freq: Once | INTRAMUSCULAR | Status: AC
  Administered 2019-04-30: 10 mg via INTRAVENOUS
  Filled 2019-04-29: qty 2

## 2019-04-29 MED ORDER — LACTATED RINGERS IV BOLUS
1000.0000 mL | Freq: Once | INTRAVENOUS | Status: AC
  Administered 2019-04-30: 1000 mL via INTRAVENOUS

## 2019-04-29 MED ORDER — SODIUM CHLORIDE 0.9% FLUSH: 3.0000 mL | Freq: Once | INTRAVENOUS | Status: DC

## 2019-04-29 NOTE — ED Triage Notes (Signed)
Patient reports that he was seen Thursday for abdominal pain, bloating, nausea, and vomiting. Patient's son reports that he has been taking Miralax, but no BM x 3-4 days.

## 2019-04-29 NOTE — ED Provider Notes (Signed)
Emergency Department Provider Note   I have reviewed the triage vital signs and the nursing notes.   HISTORY  Chief Complaint Emesis and Abdominal Pain   HPI Todd Luna is a 68 y.o. male with medical problems document below who presents the emergency department today with persistent symptoms as he had a few days ago.  Patient states that he has had abdominal discomfort, bloating, nausea and multiple doses of vomiting.improved with his medications.  Has not had a bowel movement in a few days even though is been taking MiraLAX as directed.  Has got very lightheaded and dehydrated feeling.  He is very dizzy on standing.  No fevers.  No sick contacts.  No recent travels.  No rashes.   No other associated or modifying symptoms.    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 No pertinent surgical history.  Current Outpatient Rx   Order #: [id number] Class: Historical Med   Order #: [id number] Class: Historical Med   Order #: [id number] Class: Historical Med   Order #: [id number] Class: Normal   Order #: [id number] Class: Historical Med   Order #: [id number] Class: Normal   Order #: [id number] Class: Normal   Order #: [id number] Class: Normal   Order #: [id number] Class: Normal    Allergies Patient has no known allergies.  History reviewed. No pertinent family history.  Social History Social History   Tobacco Use   Smoking status: Never Smoker   Smokeless tobacco: Never Used  Substance Use Topics   Alcohol use: Yes    Comment: social   Drug use: No    Review of Systems  All other systems negative except as documented in the HPI. All pertinent positives and negatives as reviewed in the  HPI. ____________________________________________   PHYSICAL EXAM:  VITAL SIGNS: ED Triage Vitals  Enc Vitals Group     BP 04/29/19 1807 (!) 159/79     Pulse Rate 04/29/19 1807 62     Resp 04/29/19 1807 18     Temp 04/29/19 1807 98.7 F (37.1 C)     Temp Source 04/29/19 1807 Oral     SpO2 04/29/19 1807 97 %     Weight 04/29/19 1823 160 lb (72.6 kg)     Height 04/29/19 1823 5\' 5"  (1.651 m)    Constitutional: Alert and oriented. Well appearing and in no acute distress. Eyes: Conjunctivae are normal. PERRL. EOMI. Head: Atraumatic. Nose: No congestion/rhinnorhea. Mouth/Throat: Mucous membranes are moist.  Oropharynx non-erythematous. Neck: No stridor.  No meningeal signs.   Cardiovascular: Normal rate, regular rhythm. Good peripheral circulation. Grossly normal heart sounds.   Respiratory: Normal respiratory effort.  No retractions. Lungs CTAB. Gastrointestinal: Soft and nontender. No distention. Diminished bowel sounds.  Musculoskeletal: No lower extremity tenderness nor edema. No gross deformities of extremities. Neurologic:  Normal speech and language. No gross focal neurologic deficits are appreciated.  Skin:  Skin is warm, dry and intact. No rash noted.   ____________________________________________   LABS (all labs ordered are listed, but only abnormal results are displayed)  Labs Reviewed  COMPREHENSIVE METABOLIC PANEL - Abnormal; Notable for the following components:      Result Value   Sodium 129 (*)    Chloride  95 (*)    CO2 21 (*)    Glucose, Bld 143 (*)    All other components within normal limits  URINALYSIS, ROUTINE W REFLEX MICROSCOPIC - Abnormal; Notable for the following components:   Color, Urine COLORLESS (*)    Ketones, ur 5 (*)    All other components within normal limits  LIPASE, BLOOD  CBC   ____________________________________________  EKG   EKG Interpretation  Date/Time:  Friday April 30 2019 00:14:01 EDT Ventricular Rate:  80 PR  Interval:    QRS Duration: 100 QT Interval:  399 QTC Calculation: 461 R Axis:   74 Text Interpretation:  Sinus rhythm Left atrial enlargement Borderline ST elevation, anterior leads No significant change since last tracing Confirmed by Merrily Pew [phone]) on 04/30/2019 12:40:40 AM       ____________________________________________  RADIOLOGY  Ct Abdomen Pelvis W Contrast  Result Date: 04/30/2019 CLINICAL DATA:  68 year old male with abdominal pain, nausea vomiting. EXAM: CT ABDOMEN AND PELVIS WITH CONTRAST TECHNIQUE: Multidetector CT imaging of the abdomen and pelvis was performed using the standard protocol following bolus administration of intravenous contrast. CONTRAST:  151mL OMNIPAQUE IOHEXOL 300 MG/ML  SOLN COMPARISON:  None. FINDINGS: Evaluation of this exam is limited due to respiratory motion artifact. Lower chest: Diffuse interstitial and interlobular septal thickening most consistent with edema. Trace bilateral pleural effusions noted. No intra-abdominal free air or free fluid. Hepatobiliary: No focal liver abnormality is seen. No gallstones, gallbladder wall thickening, or biliary dilatation. Pancreas: Unremarkable. No pancreatic ductal dilatation or surrounding inflammatory changes. Spleen: Normal in size without focal abnormality. Adrenals/Urinary Tract: Adrenal glands are unremarkable. Kidneys are normal, without renal calculi, focal lesion, or hydronephrosis. Bladder is unremarkable. Stomach/Bowel: There is no bowel obstruction or active inflammation. The appendix is normal. Vascular/Lymphatic: The abdominal aorta and IVC are unremarkable. No portal venous gas. There is no adenopathy. Reproductive: The prostate and seminal vesicles are grossly unremarkable. No pelvic mass. Other: None Musculoskeletal: Fatty atrophy of the rectus spinae muscle. Osteopenia with degenerative changes of the spine. No acute osseous pathology. IMPRESSION: 1. No acute intra-abdominal or pelvic pathology. No  bowel obstruction or active inflammation. Normal appendix. 2. Pulmonary edema with trace bilateral pleural effusions. Electronically Signed   By: Anner Crete M.D.   On: 04/30/2019 01:51    ____________________________________________  INITIAL IMPRESSION / ASSESSMENT AND PLAN / ED COURSE  Initial ddx: obstruction, PUD/gastritis, colitis/diverticulitis.   Workup negative. Likely symptoms from constipation so will treat for same. No e/o above diagnoses. Will fu w/ pcp for same. Son relayed information in [place].  Pertinent labs & imaging results that were available during my care of the patient were reviewed by me and considered in my medical decision making (see chart for details).  A medical screening exam was performed and I feel the patient has had an appropriate workup for their chief complaint at this time and likelihood of emergent condition existing is low. They have been counseled on decision, discharge, follow up and which symptoms necessitate immediate return to the emergency department. They or their family verbally stated understanding and agreement with plan and discharged in stable condition.   ____________________________________________  FINAL CLINICAL IMPRESSION(S) / ED DIAGNOSES  Final diagnoses:  Constipation, unspecified constipation type  Abdominal pain, unspecified abdominal location    MEDICATIONS GIVEN DURING THIS VISIT:  Medications  sodium chloride flush (NS) 0.9 % injection 3 mL (3 mLs Intravenous Not Given 04/29/19 2318)  lactated ringers bolus 1,000 mL (0 mLs Intravenous Stopped 04/30/19 0152)  prochlorperazine (COMPAZINE) injection 10 mg (10 mg Intravenous Given 04/30/19 0014)  iohexol (OMNIPAQUE) 300 MG/ML solution 100 mL (100 mLs Intravenous Contrast Given 04/30/19 0126)    NEW OUTPATIENT MEDICATIONS STARTED DURING THIS VISIT:  New Prescriptions   DOCUSATE SODIUM (COLACE) 100 MG CAPSULE    Take 1 capsule (100 mg total) by mouth every 12 (twelve)  hours.   POLYETHYLENE GLYCOL (MIRALAX / GLYCOLAX) 17 G PACKET    Take 17 g by mouth daily.   SODIUM PHOSPHATE (FLEET) 7-19 GM/118ML ENEM    Place 133 mLs (1 enema total) rectally daily as needed for severe constipation.    Note:  This note was prepared with assistance of Dragon voice recognition software. Occasional wrong-word or sound-a-like substitutions may have occurred due to the inherent limitations of voice recognition software.   Kien Mirsky, Barbara CowerJason, MD 04/30/19 [phone]

## 2019-04-30 ENCOUNTER — Emergency Department (HOSPITAL_COMMUNITY): Payer: Medicare Other

## 2019-04-30 ENCOUNTER — Encounter (HOSPITAL_COMMUNITY): Payer: Self-pay

## 2019-04-30 DIAGNOSIS — K5900 Constipation, unspecified: Secondary | ICD-10-CM | POA: Diagnosis not present

## 2019-04-30 LAB — URINALYSIS, ROUTINE W REFLEX MICROSCOPIC
Bilirubin Urine: NEGATIVE
Glucose, UA: NEGATIVE mg/dL
Hgb urine dipstick: NEGATIVE
Ketones, ur: 5 mg/dL — AB
Leukocytes,Ua: NEGATIVE
Nitrite: NEGATIVE
Protein, ur: NEGATIVE mg/dL
Specific Gravity, Urine: 1.02 (ref 1.005–1.030)
pH: 7 (ref 5.0–8.0)

## 2019-04-30 MED ORDER — SODIUM CHLORIDE (PF) 0.9 % IJ SOLN
INTRAMUSCULAR | Status: AC
  Filled 2019-04-30: qty 50

## 2019-04-30 MED ORDER — FLEET ENEMA 7-19 GM/118ML RE ENEM: 1.0000 | ENEMA | Freq: Every day | RECTAL | 1 refills | Status: AC | PRN

## 2019-04-30 MED ORDER — POLYETHYLENE GLYCOL 3350 17 G PO PACK: 17.0000 g | PACK | Freq: Every day | ORAL | 0 refills | Status: AC

## 2019-04-30 MED ORDER — DOCUSATE SODIUM 100 MG PO CAPS: 100.0000 mg | ORAL_CAPSULE | Freq: Two times a day (BID) | ORAL | 0 refills | Status: AC

## 2019-04-30 MED ORDER — IOHEXOL 300 MG/ML  SOLN
100.0000 mL | Freq: Once | INTRAMUSCULAR | Status: AC | PRN
  Administered 2019-04-30: 01:00:00 100 mL via INTRAVENOUS

## 2019-04-30 NOTE — Discharge Instructions (Addendum)

## 2019-05-03 ENCOUNTER — Other Ambulatory Visit: Payer: Self-pay

## 2019-05-03 ENCOUNTER — Emergency Department (HOSPITAL_COMMUNITY): Payer: Medicare Other

## 2019-05-03 ENCOUNTER — Emergency Department (HOSPITAL_COMMUNITY)
Admission: EM | Admit: 2019-05-03 | Discharge: 2019-05-03 | Disposition: A | Discharge status: Home / Self Care | Payer: Medicare Other | Attending: Emergency Medicine | Admitting: Emergency Medicine

## 2019-05-03 DIAGNOSIS — I10 Essential (primary) hypertension: Secondary | ICD-10-CM | POA: Insufficient documentation

## 2019-05-03 DIAGNOSIS — R5383 Other fatigue: Secondary | ICD-10-CM | POA: Insufficient documentation

## 2019-05-03 DIAGNOSIS — R109 Unspecified abdominal pain: Secondary | ICD-10-CM | POA: Diagnosis not present

## 2019-05-03 DIAGNOSIS — Z79899 Other long term (current) drug therapy: Secondary | ICD-10-CM | POA: Insufficient documentation

## 2019-05-03 DIAGNOSIS — R112 Nausea with vomiting, unspecified: Secondary | ICD-10-CM | POA: Insufficient documentation

## 2019-05-03 LAB — CBC WITH DIFFERENTIAL/PLATELET
Abs Immature Granulocytes: 0.05 10*3/uL (ref 0.00–0.07)
Basophils Absolute: 0 10*3/uL (ref 0.0–0.1)
Basophils Relative: 0 %
Eosinophils Absolute: 0 10*3/uL (ref 0.0–0.5)
Eosinophils Relative: 0 %
HCT: 43.7 % (ref 39.0–52.0)
Hemoglobin: 14.7 g/dL (ref 13.0–17.0)
Immature Granulocytes: 1 %
Lymphocytes Relative: 9 %
Lymphs Abs: 1 10*3/uL (ref 0.7–4.0)
MCH: 27.3 pg (ref 26.0–34.0)
MCHC: 33.6 g/dL (ref 30.0–36.0)
MCV: 81.1 fL (ref 80.0–100.0)
Monocytes Absolute: 0.7 10*3/uL (ref 0.1–1.0)
Monocytes Relative: 6 %
Neutro Abs: 8.9 10*3/uL — ABNORMAL HIGH (ref 1.7–7.7)
Neutrophils Relative %: 84 %
Platelets: 234 10*3/uL (ref 150–400)
RBC: 5.39 MIL/uL (ref 4.22–5.81)
RDW: 12.4 % (ref 11.5–15.5)
WBC: 10.6 10*3/uL — ABNORMAL HIGH (ref 4.0–10.5)
nRBC: 0 % (ref 0.0–0.2)

## 2019-05-03 LAB — COMPREHENSIVE METABOLIC PANEL
ALT: 19 U/L (ref 0–44)
AST: 22 U/L (ref 15–41)
Albumin: 4.3 g/dL (ref 3.5–5.0)
Alkaline Phosphatase: 59 U/L (ref 38–126)
Anion gap: 10 (ref 5–15)
BUN: 7 mg/dL — ABNORMAL LOW (ref 8–23)
CO2: 24 mmol/L (ref 22–32)
Calcium: 9.1 mg/dL (ref 8.9–10.3)
Chloride: 89 mmol/L — ABNORMAL LOW (ref 98–111)
Creatinine, Ser: 0.77 mg/dL (ref 0.61–1.24)
GFR calc Af Amer: >60 mL/min (ref >60)
GFR calc non Af Amer: >60 mL/min (ref >60)
Glucose, Bld: 121 mg/dL — ABNORMAL HIGH (ref 70–99)
Potassium: 3.8 mmol/L (ref 3.5–5.1)
Sodium: 123 mmol/L — ABNORMAL LOW (ref 135–145)
Total Bilirubin: 0.9 mg/dL (ref 0.3–1.2)
Total Protein: 7.5 g/dL (ref 6.5–8.1)

## 2019-05-03 LAB — LIPASE, BLOOD: Lipase: 30 U/L (ref 11–51)

## 2019-05-03 MED ORDER — ONDANSETRON HCL 4 MG/2ML IJ SOLN
INTRAMUSCULAR | Status: AC
  Administered 2019-05-03: 18:00:00 4 mg via INTRAVENOUS
  Filled 2019-05-03: qty 2

## 2019-05-03 MED ORDER — ONDANSETRON HCL 4 MG/2ML IJ SOLN
4.0000 mg | Freq: Once | INTRAMUSCULAR | Status: AC
  Administered 2019-05-03: 18:00:00 4 mg via INTRAVENOUS

## 2019-05-03 MED ORDER — SODIUM CHLORIDE 0.9 % IV BOLUS
1000.0000 mL | Freq: Once | INTRAVENOUS | Status: AC
  Administered 2019-05-03: 18:00:00 1000 mL via INTRAVENOUS

## 2019-05-03 MED ORDER — IOHEXOL 300 MG/ML  SOLN
100.0000 mL | Freq: Once | INTRAMUSCULAR | Status: AC | PRN
  Administered 2019-05-03: 20:00:00 100 mL via INTRAVENOUS

## 2019-05-03 MED ORDER — PROMETHAZINE HCL 25 MG RE SUPP: 25.0000 mg | Freq: Four times a day (QID) | RECTAL | 0 refills | Status: AC | PRN

## 2019-05-03 NOTE — ED Notes (Signed)
Large emesis episode x 1. Provider Benjamine Mola) made aware. IVF and Zofran ordered, see MAR. Jonna and this RN cleaned room, placed patient in a new gown and repositioned patient in bed. Will continue to monitor.

## 2019-05-03 NOTE — Discharge Instructions (Addendum)
Take the Zofran and Pepcid as prescribed at your earlier ER visit. Work on eating small meals with bland foods such as bananas, rice, crackers, toast. Follow-up with your doctor.

## 2019-05-03 NOTE — ED Notes (Signed)
Discharge instructions reviewed with patient. Opportunity for questions and concerns allowed. Patient stable and ambulatory at discharge.

## 2019-05-03 NOTE — ED Provider Notes (Addendum)
[facility]-EMERGENCY DEPT Provider Note   CSN: [id number] Arrival date & time: 05/03/19  1131     History   Chief Complaint Chief Complaint  Patient presents with  . Emesis  . Nausea    HPI Todd Luna is a 68 y.o. male.     68yo male presents to the ER with daughter to translate for nausea and vomiting with eating. Taking Miralax, colace, Fleet enema and Magnesium citrate with small bowel movement today. Also reports fatigue. Denies fevers, chest pain, shortness of breath, diaphoresis, changes in bladder habits. Reports feeling tight/bloated/full after eating.  Patient seen in the ER for same 8/24 and 8/27, CT abdomen/pelvis unremarkable, labs without significant findings.  Patient was in [place] 1 year ago and developed similar symptoms, symptoms resolved without intervention.      Past Medical History:  Diagnosis Date  . Hearing loss   . High cholesterol   . Hypertension     Patient Active Problem List   Diagnosis Date Noted  . Hyponatremia 09/07/2014  . Intractable hiccups   . 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 High frequency hearing loss 03/09/2014    No past surgical history on file.      Home Medications    Prior to Admission medications   Medication Sig Start Date End Date Taking? Authorizing Provider  amLODipine (NORVASC) 5 MG tablet Take 5 mg by mouth daily. 02/03/19  Yes [provider]  atorvastatin (LIPITOR) 40 MG tablet Take 40 mg by mouth daily.   Yes [provider]  cetirizine (ZYRTEC) 10 MG tablet Take 10 mg by mouth daily. 02/02/19  Yes [provider]  famotidine (PEPCID) 20 MG tablet Take 1 tablet (20 mg total) by mouth 2 (two) times daily. 04/26/19  Yes Wynetta FinesMessick, Peter C, MD  losartan-hydrochlorothiazide (HYZAAR) 100-12.5 MG tablet Take 1 tablet by mouth daily.   Yes [provider]  polyethylene glycol (MIRALAX / GLYCOLAX) 17 g packet Take 17 g by mouth daily. 04/30/19   Yes Mesner, Barbara CowerJason, MD  docusate sodium (COLACE) 100 MG capsule Take 1 capsule (100 mg total) by mouth every 12 (twelve) hours. 04/30/19   Mesner, Barbara CowerJason, MD  ondansetron (ZOFRAN) 4 MG tablet Take 1 tablet (4 mg total) by mouth every 6 (six) hours. 04/26/19   Wynetta FinesMessick, Peter C, MD  promethazine (PHENERGAN) 25 MG suppository Place 1 suppository (25 mg total) rectally every 6 (six) hours as needed for nausea or vomiting. 05/03/19   Henderly, Britni A, PA-C  sodium phosphate (FLEET) 7-19 GM/118ML ENEM Place 133 mLs (1 enema total) rectally daily as needed for severe constipation. 04/30/19   Mesner, Barbara CowerJason, MD  chlorproMAZINE (THORAZINE) 50 MG tablet Take 1 tablet (50 mg total) by mouth 4 (four) times daily as needed for hiccoughs. Patient not taking: Reported on 04/30/2019 09/08/14 04/30/19  Dorothea OgleMyers, Iskra M, MD  fluticasone [facility](FLONASE) 50 MCG/ACT nasal spray Place 2 sprays into both nostrils daily. Patient not taking: Reported on 09/06/2014 12/27/13 04/30/19  Lenell AntuLe, Thao P, DO    Family History No family history on file.  Social History Social History   Tobacco Use  . Smoking status: Never Smoker  . Smokeless tobacco: Never Used  Substance Use Topics  . Alcohol use: Yes    Comment: social  . Drug use: No     Allergies   Patient has no known allergies.   Review of Systems Review of Systems  Constitutional: Positive for fatigue. Negative for fever.  Respiratory:  Negative for shortness of breath.   Cardiovascular: Negative for chest pain.  Gastrointestinal: Positive for abdominal pain, nausea and vomiting. Negative for blood in stool, constipation and diarrhea.  Genitourinary: Negative for decreased urine volume and difficulty urinating.  Musculoskeletal: Negative for arthralgias and myalgias.  Skin: Negative for rash and wound.  Allergic/Immunologic: Negative for immunocompromised state.  Neurological: Negative for weakness.  Psychiatric/Behavioral: Negative for confusion.  All other systems reviewed  and are negative.    Physical Exam Updated Vital Signs BP (!) 162/89 (BP Location: Left Arm)   Pulse 80   Temp 98.7 F (37.1 C) (Oral)   Resp 19   SpO2 97%   Physical Exam Vitals signs and nursing note reviewed.  Constitutional:      General: He is not in acute distress.    Appearance: He is well-developed. He is not diaphoretic.  HENT:     Head: Normocephalic and atraumatic.  Cardiovascular:     Rate and Rhythm: Normal rate and regular rhythm.     Pulses: Normal pulses.     Heart sounds: Normal heart sounds.  Pulmonary:     Effort: Pulmonary effort is normal.     Breath sounds: Normal breath sounds.  Abdominal:     Palpations: Abdomen is soft.     Tenderness: There is no abdominal tenderness.  Musculoskeletal:     Right lower leg: No edema.     Left lower leg: No edema.  Skin:    General: Skin is warm and dry.     Findings: No erythema or rash.  Neurological:     Mental Status: He is alert and oriented to person, place, and time.  Psychiatric:        Behavior: Behavior normal.      ED Treatments / Results  Labs (all labs ordered are listed, but only abnormal results are displayed) Labs Reviewed  COMPREHENSIVE METABOLIC PANEL - Abnormal; Notable for the following components:      Result Value   Sodium 123 (*)    Chloride 89 (*)    Glucose, Bld 121 (*)    BUN 7 (*)    All other components within normal limits  CBC WITH DIFFERENTIAL/PLATELET - Abnormal; Notable for the following components:   WBC 10.6 (*)    Neutro Abs 8.9 (*)    All other components within normal limits  LIPASE, BLOOD    EKG EKG Interpretation  Date/Time:  Monday May 03 2019 14:52:57 EDT Ventricular Rate:  71 PR Interval:    QRS Duration: 106 QT Interval:  412 QTC Calculation: 448 R Axis:   85 Text Interpretation:  Sinus tachycardia Ventricular tachycardia, unsustained Borderline right axis deviation Borderline ST depression, inferior leads No significant change since last  tracing Confirmed by Linwood Dibbles [phone]) on 05/04/2019 12:38:21 AM   Radiology Ct Abdomen Pelvis W Contrast  Result Date: 05/03/2019 CLINICAL DATA:  68 year old male with acute abdominal pain with nausea and vomiting. EXAM: CT ABDOMEN AND PELVIS WITH CONTRAST TECHNIQUE: Multidetector CT imaging of the abdomen and pelvis was performed using the standard protocol following bolus administration of intravenous contrast. CONTRAST:  OMNIPAQUE IOHEXOL 300 MG/ML  SOLN COMPARISON:  04/30/2019 CT FINDINGS: Lower chest: No new abnormality. Mild bibasilar atelectasis again noted. Hepatobiliary: The liver and gallbladder are unremarkable. No biliary dilatation. Pancreas: Unremarkable Spleen: Unremarkable Adrenals/Urinary Tract: The kidneys, adrenal glands and bladder are unremarkable. Stomach/Bowel: Stomach is within normal limits. Appendix appears normal. No evidence of bowel wall thickening, distention, or inflammatory changes.  Vascular/Lymphatic: Aortic atherosclerosis. No enlarged abdominal or pelvic lymph nodes. Reproductive: Prostate is unremarkable. Other: No ascites, abscess or pneumoperitoneum. Musculoskeletal: No acute or suspicious bony abnormality. IMPRESSION: 1. No evidence of acute abnormality. 2.  Aortic Atherosclerosis (ICD10-I70.0). Electronically Signed   By: Margarette Canada M.D.   On: 05/03/2019 20:42    Procedures Procedures (including critical care time)  Medications Ordered in ED Medications  ondansetron (ZOFRAN) injection 4 mg (4 mg Intravenous Given 05/03/19 1737)  sodium chloride 0.9 % bolus 1,000 mL (0 mLs Intravenous Stopped 05/03/19 2021)  iohexol (OMNIPAQUE) 300 MG/ML solution 100 mL (100 mLs Intravenous Contrast Given 05/03/19 2019)     Initial Impression / Assessment and Plan / ED Course  I have reviewed the triage vital signs and the nursing notes.  Pertinent labs & imaging results that were available during my care of the patient were reviewed by me and considered in my medical  decision making (see chart for details).  Clinical Course as of May 03 1825  Mon May 03, 2019  1436 68yo male presents with ongoing n/v, abdominal bloating and discomfort. Patient has been seen for same twice in the ER without any findings on work-up.  Patient has been trying laxatives and stool softeners at home as directed, states he has had one bowel movement this morning.  Patient was scheduled to see his doctor today however had ongoing symptoms so he came to the emergency room instead.  Patient denies any chest pain or shortness of breath.  Exam is unremarkable, patient is well-appearing, abdomen is soft and nontender.  Plan is to check labs and EKG, if no significant findings today, may be discharged to take his prescriptions for Pepcid Zofran as prescribed at prior ER visit with plan to recheck with PCP.  Advised patient's daughter that patient may need further GI work-up or test not available to the emergency room.   [LM]  1610 Care signed out to Wyn Quaker, PA-C pending labs.   [LM]  1727 RN informed that patient just vomited.  Zofran, fluids ordered   [EH]    Clinical Course User Index [EH] Lorin Glass, PA-C [LM] Tacy Learn, PA-C      Final Clinical Impressions(s) / ED Diagnoses   Final diagnoses:  Non-intractable vomiting with nausea, unspecified vomiting type    ED Discharge Orders         Ordered    Ambulatory referral to Gastroenterology     05/03/19 2156    promethazine (PHENERGAN) 25 MG suppository  Every 6 hours PRN     05/03/19 2156           Tacy Learn, PA-C 05/03/19 1505    Tacy Learn, PA-C 05/04/19 Kalaheo, Ankit, MD 05/05/19 [phone]

## 2019-05-03 NOTE — ED Notes (Signed)
Report given to Taylor, RN. Care transferred at this time. 

## 2019-05-03 NOTE — ED Provider Notes (Signed)
I assumed care of patient from previous team, please see their note for full H&P.  Who presents today for evaluation of nausea and vomiting with eating.  He has been taking MiraLAX, Colace, using Fleet enemas and magnesium citrate and only had a small bowel movement today.  He reports associated fatigue along with feeling bloated after eating.      Physical Exam  BP (!) 160/84   Pulse 70   Temp 98.7 F (37.1 C) (Oral)   Resp 18   SpO2 99%   Physical Exam Vitals signs and nursing note reviewed.  Cardiovascular:     Rate and Rhythm: Normal rate.  Pulmonary:     Effort: Pulmonary effort is normal. No respiratory distress.  Abdominal:     Tenderness: There is abdominal tenderness (Generalized).  Neurological:     Mental Status: He is alert.  Psychiatric:        Mood and Affect: Mood normal.      ED Course/Procedures   Clinical Course as of May 02 2036  Mon May 03, 2019  1436 68yo male presents with ongoing n/v, abdominal bloating and discomfort. Patient has been seen for same twice in the ER without any findings on work-up.  Patient has been trying laxatives and stool softeners at home as directed, states he has had one bowel movement this morning.  Patient was scheduled to see his doctor today however had ongoing symptoms so he came to the emergency room instead.  Patient denies any chest pain or shortness of breath.  Exam is unremarkable, patient is well-appearing, abdomen is soft and nontender.  Plan is to check labs and EKG, if no significant findings today, may be discharged to take his prescriptions for Pepcid Zofran as prescribed at prior ER visit with plan to recheck with PCP.  Advised patient's daughter that patient may need further GI work-up or test not available to the emergency room.   [LM]  9163 Care signed out to Wyn Quaker, PA-C pending labs.   [LM]  1727 RN informed that patient just vomited.  Zofran, fluids ordered   [EH]    Clinical Course User Index [EH]  Lorin Glass, PA-C [LM] Tacy Learn, PA-C    Procedures  MDM  Plan to follow-up on labs.  Suspicion is that patient did not feel his Zofran that was previously prescribed.  While in the ER was informed that, despite being n.p.o., patient vomited a significant amount in his room.  On exam his abdomen is generally tender.  Chart review shows that previous CT scan was obtained with only IV contrast, no p.o. contrast.  CT abdomen pelvis was ordered with p.o. contrast to further evaluate.  Labs are generally reassuring.    At shift change care was transferred to [facility] who will follow pending studies, re-evaulate and determine disposition.         Ollen Gross 05/03/19 2040    Dorie Rank, MD 05/04/19 [phone]

## 2019-05-03 NOTE — ED Notes (Signed)
Patient transported to CT 

## 2019-05-03 NOTE — ED Triage Notes (Addendum)
Pt states he is nauseous every time he eats. Pt states he is not having any pain. Pt states his last BM a few days ago. Pt denies SOB, cough, fever.

## 2019-05-03 NOTE — ED Notes (Signed)
Provided patient with water and instructed to notify RN if feeling nauseous or vomits.

## 2019-05-03 NOTE — ED Provider Notes (Signed)
Care assumed from [place], [place] as well as Percell Miller, PA-C.  See note for full HPI.  [place] 68-year-old male appears otherwise well presents for evaluation of nausea and emesis.  Has been taking MiraLAX, Colace and mag citrate for his chronic constipation.  Has also had fatigue.  Had similar symptoms 1 year ago however that is resolved.  Labs at baseline per previous provider.  Patient had CT scan ordered with oral and IV contrast.  Low suspicion for mesenteric ischemia, gallbladder pathology as cause of pain.  Plan for follow-up p.o. challenge.  If able to tolerate p.o. can DC him with antiemetics.  Per previous provider family did not think that they had filled Zofran at previous visit. Physical Exam  BP (!) 152/93 (BP Location: Left Arm)   Pulse 79   Temp 98.7 F (37.1 C) (Oral)   Resp 14   SpO2 96%   Physical Exam Vitals signs and nursing note reviewed.  Constitutional:      General: He is not in acute distress.    Appearance: He is well-developed. He is not ill-appearing, toxic-appearing or diaphoretic.  HENT:     Head: Normocephalic and atraumatic.     Nose: Nose normal.     Mouth/Throat:     Mouth: Mucous membranes are moist.     Pharynx: Oropharynx is clear.  Eyes:     Pupils: Pupils are equal, round, and reactive to light.  Neck:     Musculoskeletal: Normal range of motion and neck supple.  Cardiovascular:     Rate and Rhythm: Normal rate and regular rhythm.     Pulses: Normal pulses.     Heart sounds: Normal heart sounds.  Pulmonary:     Effort: Pulmonary effort is normal. No respiratory distress.     Breath sounds: Normal breath sounds.  Abdominal:     General: Bowel sounds are normal. There is no distension.     Palpations: Abdomen is soft.     Tenderness: There is no abdominal tenderness. There is no right CVA tenderness, left CVA tenderness, guarding or rebound.     Hernia: No hernia is present.     Comments: Soft, nontender without rebound or guarding.  Negative  Murphy sign.  Musculoskeletal: Normal range of motion.  Skin:    General: Skin is warm and dry.  Neurological:     Mental Status: He is alert.    ED Course/Procedures   Clinical Course as of May 02 2156  Mon May 03, 2019  1436 68yo male presents with ongoing n/v, abdominal bloating and discomfort. Patient has been seen for same twice in the ER without any findings on work-up.  Patient has been trying laxatives and stool softeners at home as directed, states he has had one bowel movement this morning.  Patient was scheduled to see his doctor today however had ongoing symptoms so he came to the emergency room instead.  Patient denies any chest pain or shortness of breath.  Exam is unremarkable, patient is well-appearing, abdomen is soft and nontender.  Plan is to check labs and EKG, if no significant findings today, may be discharged to take his prescriptions for Pepcid Zofran as prescribed at prior ER visit with plan to recheck with PCP.  Advised patient's daughter that patient may need further GI work-up or test not available to the emergency room.   [LM]  4128 Care signed out to Wyn Quaker, PA-C pending labs.   [LM]  1727 RN informed that patient just vomited.  Zofran, fluids ordered   [EH]    Clinical Course User Index [EH] Cristina GongHammond, Elizabeth W, PA-C [LM] Jeannie FendMurphy, Laura A, PA-C    Procedures Ct Abdomen Pelvis W Contrast  Result Date: 05/03/2019 CLINICAL DATA:  68 year old male with acute abdominal pain with nausea and vomiting. EXAM: CT ABDOMEN AND PELVIS WITH CONTRAST TECHNIQUE: Multidetector CT imaging of the abdomen and pelvis was performed using the standard protocol following bolus administration of intravenous contrast. CONTRAST:  100mL OMNIPAQUE IOHEXOL 300 MG/ML  SOLN COMPARISON:  04/30/2019 CT FINDINGS: Lower chest: No new abnormality. Mild bibasilar atelectasis again noted. Hepatobiliary: The liver and gallbladder are unremarkable. No biliary dilatation. Pancreas:  Unremarkable Spleen: Unremarkable Adrenals/Urinary Tract: The kidneys, adrenal glands and bladder are unremarkable. Stomach/Bowel: Stomach is within normal limits. Appendix appears normal. No evidence of bowel wall thickening, distention, or inflammatory changes. Vascular/Lymphatic: Aortic atherosclerosis. No enlarged abdominal or pelvic lymph nodes. Reproductive: Prostate is unremarkable. Other: No ascites, abscess or pneumoperitoneum. Musculoskeletal: No acute or suspicious bony abnormality. IMPRESSION: 1. No evidence of acute abnormality. 2.  Aortic Atherosclerosis (ICD10-I70.0). Electronically Signed   By: Harmon PierJeffrey  Hu M.D.   On: 05/03/2019 20:42   Ct Abdomen Pelvis W Contrast  Result Date: 04/30/2019 CLINICAL DATA:  68 year old male with abdominal pain, nausea vomiting. EXAM: CT ABDOMEN AND PELVIS WITH CONTRAST TECHNIQUE: Multidetector CT imaging of the abdomen and pelvis was performed using the standard protocol following bolus administration of intravenous contrast. CONTRAST:  100mL OMNIPAQUE IOHEXOL 300 MG/ML  SOLN COMPARISON:  None. FINDINGS: Evaluation of this exam is limited due to respiratory motion artifact. Lower chest: Diffuse interstitial and interlobular septal thickening most consistent with edema. Trace bilateral pleural effusions noted. No intra-abdominal free air or free fluid. Hepatobiliary: No focal liver abnormality is seen. No gallstones, gallbladder wall thickening, or biliary dilatation. Pancreas: Unremarkable. No pancreatic ductal dilatation or surrounding inflammatory changes. Spleen: Normal in size without focal abnormality. Adrenals/Urinary Tract: Adrenal glands are unremarkable. Kidneys are normal, without renal calculi, focal lesion, or hydronephrosis. Bladder is unremarkable. Stomach/Bowel: There is no bowel obstruction or active inflammation. The appendix is normal. Vascular/Lymphatic: The abdominal aorta and IVC are unremarkable. No portal venous gas. There is no adenopathy.  Reproductive: The prostate and seminal vesicles are grossly unremarkable. No pelvic mass. Other: None Musculoskeletal: Fatty atrophy of the rectus spinae muscle. Osteopenia with degenerative changes of the spine. No acute osseous pathology. IMPRESSION: 1. No acute intra-abdominal or pelvic pathology. No bowel obstruction or active inflammation. Normal appendix. 2. Pulmonary edema with trace bilateral pleural effusions. Electronically Signed   By: Elgie CollardArash  Radparvar M.D.   On: 04/30/2019 01:51   Labs Reviewed  COMPREHENSIVE METABOLIC PANEL - Abnormal; Notable for the following components:      Result Value   Sodium 123 (*)    Chloride 89 (*)    Glucose, Bld 121 (*)    BUN 7 (*)    All other components within normal limits  CBC WITH DIFFERENTIAL/PLATELET - Abnormal; Notable for the following components:   WBC 10.6 (*)    Neutro Abs 8.9 (*)    All other components within normal limits  LIPASE, BLOOD   MDM  Care transferred from previous providers at shift change.  In summation patient pending CT scan and p.o. challenge.  Labs at baseline, patient with chronic hyponatremia.  If negative and able to tolerate p.o. challenge may DC him with prescriptions for emesis.  May need follow-up with GI.  No hemoptysis, melena, hematochezia.  CT scan negative for acute  pathology.  Patient able to tolerate p.o. intake without difficulty.  Shared decision making with family in room possible admission for intractable emesis given this is his third visit this week for similar symptoms however family would like to follow-up outpatient.  Will DC home with Phenergan PR and will place GI consult.  Patient does have follow-up with PCP at 10 AM tomorrow morning.  Discussed strict return precautions.  Family voices understanding and is agreeable for followup.  Patient is nontoxic, nonseptic appearing, in no apparent distress.  Patient's pain and other symptoms adequately managed in emergency department.  Fluid bolus given.   Labs, imaging and vitals reviewed.  Patient does not meet the SIRS or Sepsis criteria.  On repeat exam patient does not have a surgical abdomin and there are no peritoneal signs.  No indication of appendicitis, bowel obstruction, bowel perforation, cholecystitis, diverticulitis, mesenteric ischemia.  Patient discharged home with symptomatic treatment and given strict instructions for follow-up with their primary care physician.  I have also discussed reasons to return immediately to the ER.  Patient expresses understanding and agrees with plan.  The patient has been appropriately medically screened and/or stabilized in the ED. I have low suspicion for any other emergent medical condition which would require further screening, evaluation or treatment in the ED or require inpatient management.  Patient is hemodynamically stable and in no acute distress.  Patient able to ambulate in department prior to ED.  Evaluation does not show acute pathology that would require ongoing or additional emergent interventions while in the emergency department or further inpatient treatment.  I have discussed the diagnosis with the patient and answered all questions.  Pain is been managed while in the emergency department and patient has no further complaints prior to discharge.  Patient is comfortable with plan discussed in room and is stable for discharge at this time.  I have discussed strict return precautions for returning to the emergency department.  Patient was encouraged to follow-up with PCP/specialist refer to at discharge.      Ayen Viviano A, PA-C 05/03/19 2157    Loren Racer, MD 05/03/19 2234

## 2020-12-21 IMAGING — CT CT ABD-PELV W/ CM
2 of 5 series · 16 of 46 positions shown, 18 images · IV contrast (omnipaque)
Comparison: 04/30/2019 CT

CLINICAL DATA: 68-year-old male with acute abdominal pain with
nausea and vomiting.

EXAM:
CT ABDOMEN AND PELVIS WITH CONTRAST
TECHNIQUE: Multidetector CT imaging of the abdomen and pelvis was performed
using the standard protocol following bolus administration of
intravenous contrast.
CONTRAST:  100mL OMNIPAQUE IOHEXOL 300 MG/ML  SOLN

[Series 2: axial st · axial · 0.68mm/px · z∈[+941,+1361]mm · 13 of 97 slices shown, 15 images]
[im 7/97  soft-tissue]
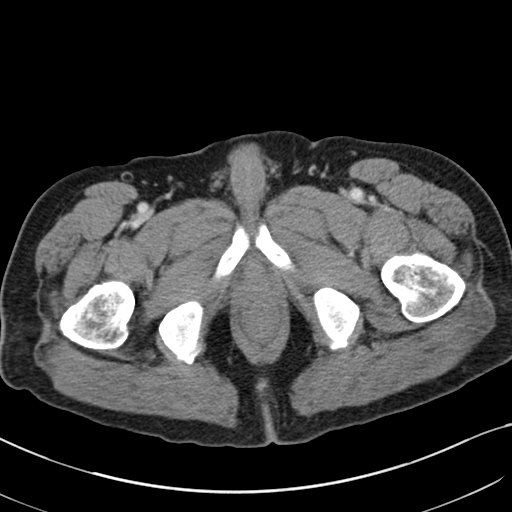
[im 7/97  bone]
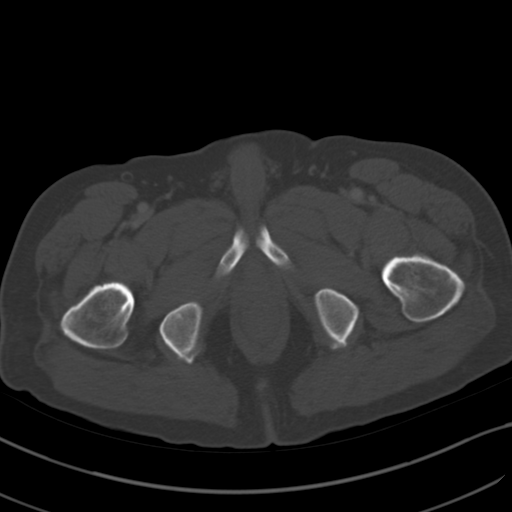
[im 13/97  soft-tissue]
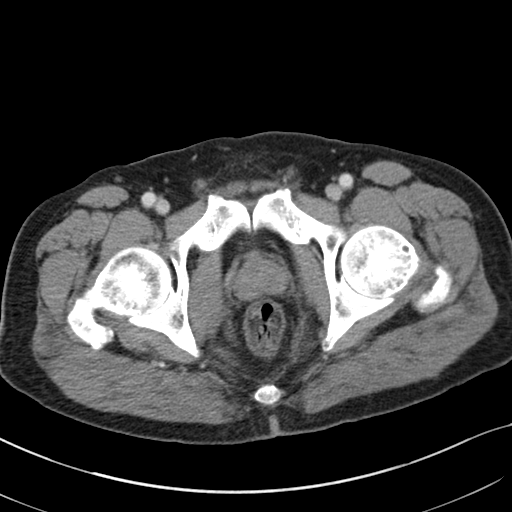
[im 19/97  soft-tissue]
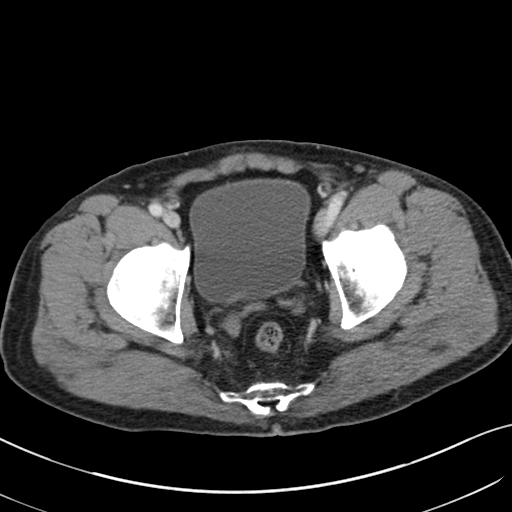
[im 31/97  soft-tissue]
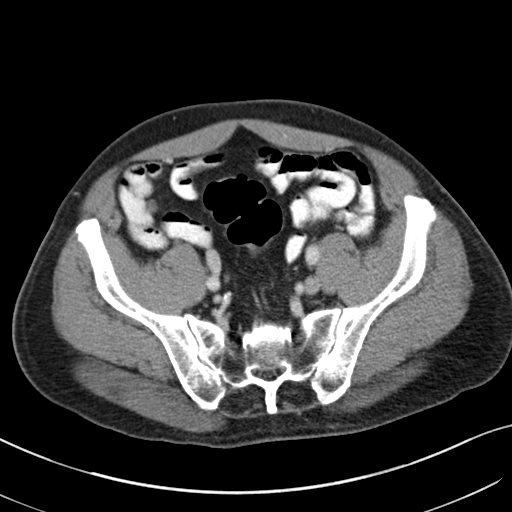
[im 37/97  soft-tissue]
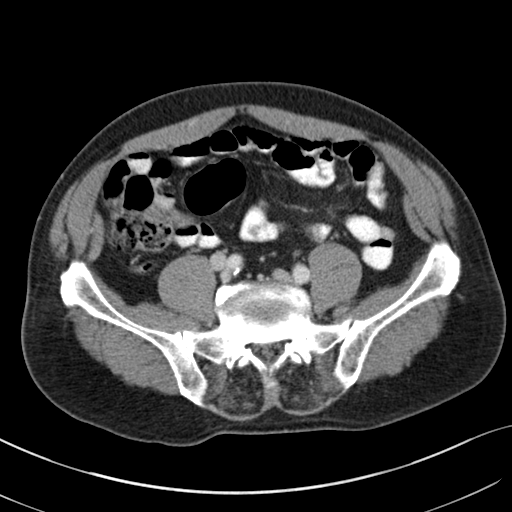
[im 43/97  soft-tissue]
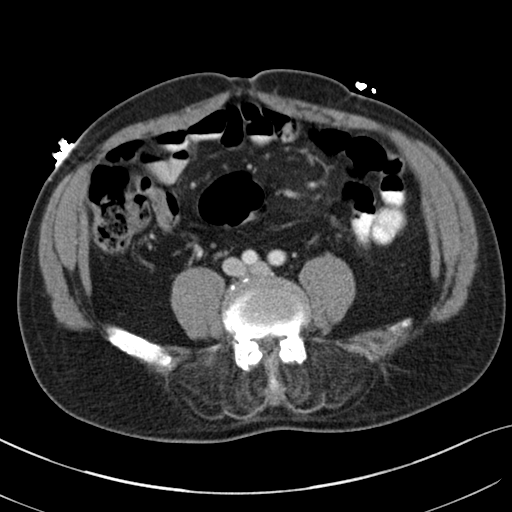
[im 49/97  soft-tissue]
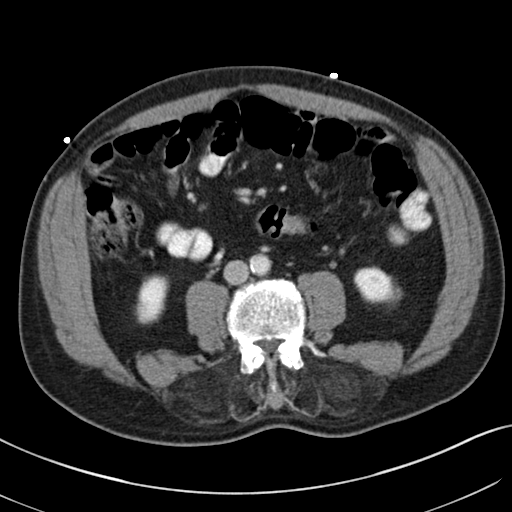
[im 55/97  soft-tissue]
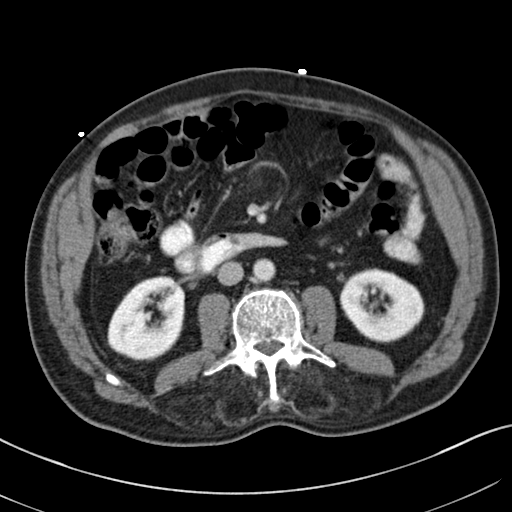
[im 61/97  soft-tissue]
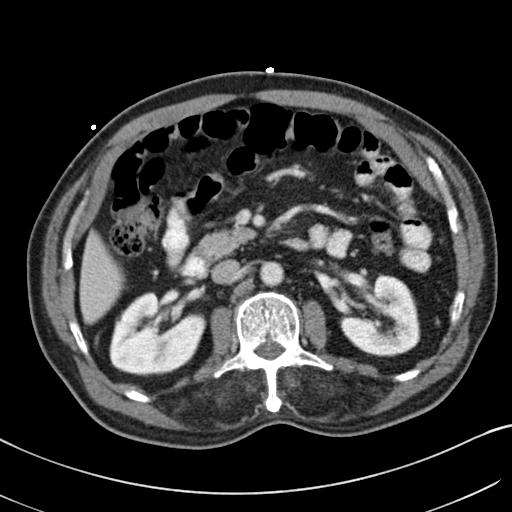
[im 61/97  bone]
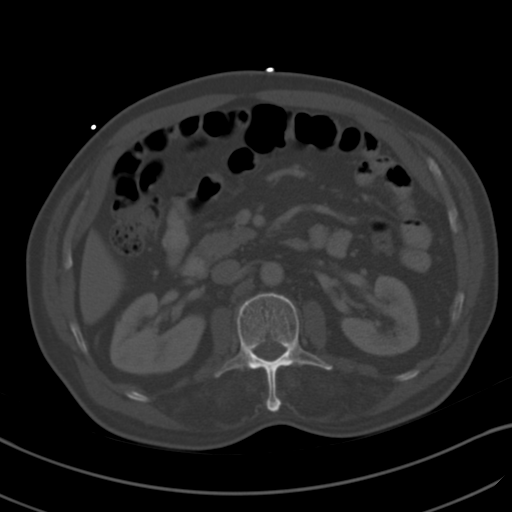
[im 67/97  soft-tissue]
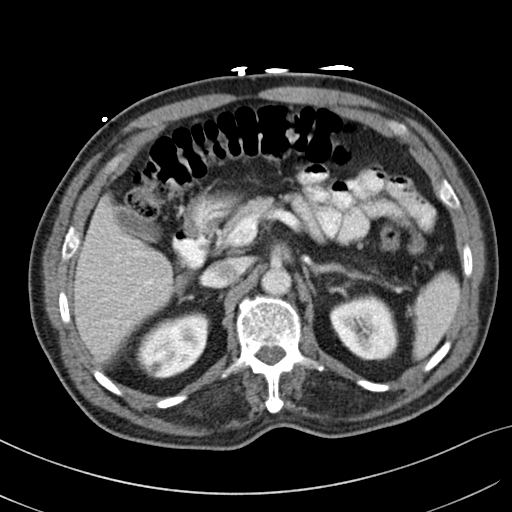
[im 79/97  soft-tissue]
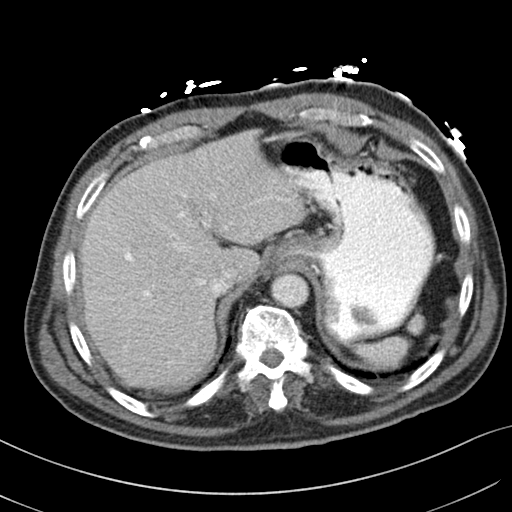
[im 85/97  soft-tissue]
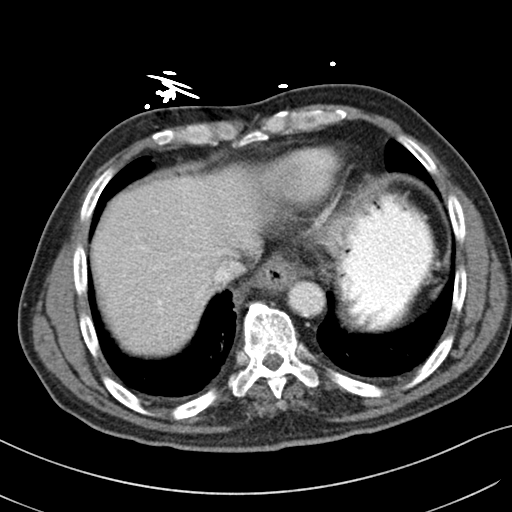
[im 91/97  soft-tissue]
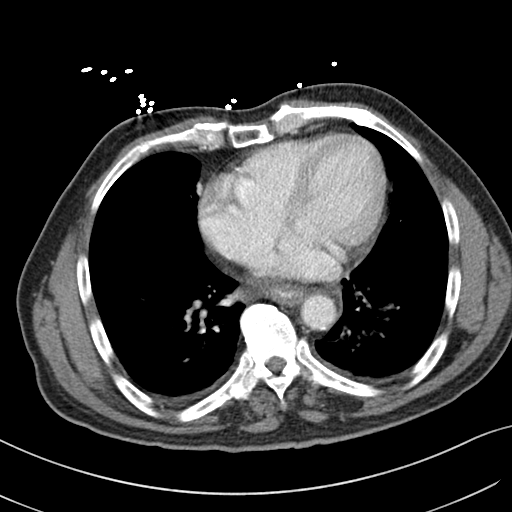

[Series 5: coronal st · coronal · 0.65mm/px · 3 of 135 slices shown]
[im 45/135  soft-tissue]
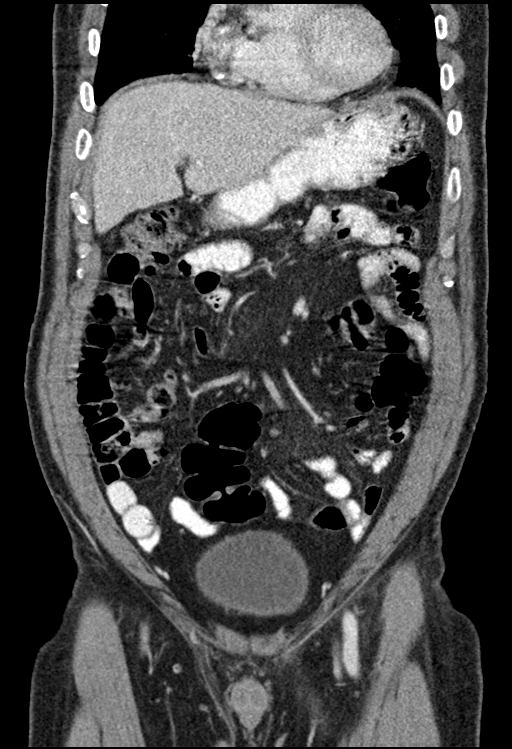
[im 60/135  soft-tissue]
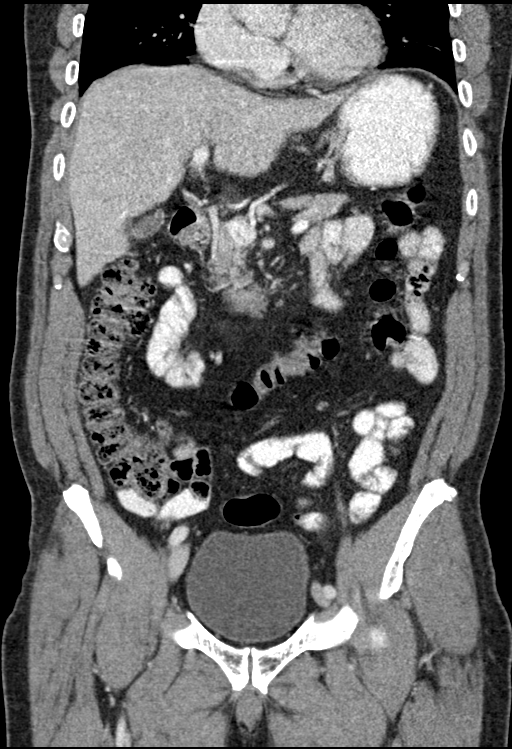
[im 75/135  soft-tissue]
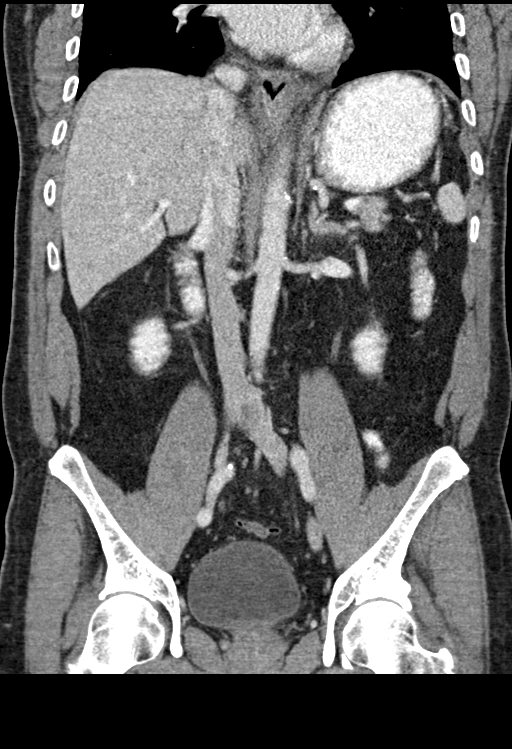

[16 of 46 positions shown; findings below may reference images not displayed]

FINDINGS: Lower chest: No new abnormality. Mild bibasilar atelectasis again
noted.

Hepatobiliary: The liver and gallbladder are unremarkable. No
biliary dilatation.

Pancreas: Unremarkable

Spleen: Unremarkable

Adrenals/Urinary Tract: The kidneys, adrenal glands and bladder are
unremarkable.

Stomach/Bowel: Stomach is within normal limits. Appendix appears
normal. No evidence of bowel wall thickening, distention, or
inflammatory changes.

Vascular/Lymphatic: Aortic atherosclerosis. No enlarged abdominal or
pelvic lymph nodes.

Reproductive: Prostate is unremarkable.

Other: No ascites, abscess or pneumoperitoneum.

Musculoskeletal: No acute or suspicious bony abnormality.
IMPRESSION: 1. No evidence of acute abnormality.
2.  Aortic Atherosclerosis ([id number]).

## 2023-11-12 ENCOUNTER — Other Ambulatory Visit: Payer: Self-pay

## 2023-11-12 ENCOUNTER — Encounter (HOSPITAL_BASED_OUTPATIENT_CLINIC_OR_DEPARTMENT_OTHER): Payer: Self-pay | Admitting: Emergency Medicine

## 2023-11-12 DIAGNOSIS — Z79899 Other long term (current) drug therapy: Secondary | ICD-10-CM | POA: Diagnosis not present

## 2023-11-12 DIAGNOSIS — K029 Dental caries, unspecified: Secondary | ICD-10-CM | POA: Diagnosis not present

## 2023-11-12 DIAGNOSIS — R6884 Jaw pain: Secondary | ICD-10-CM | POA: Diagnosis present

## 2023-11-12 NOTE — ED Triage Notes (Signed)
 Pt c/o dental pain (RT lower) x 2d; Tylenol not helping

## 2023-11-13 ENCOUNTER — Emergency Department (HOSPITAL_BASED_OUTPATIENT_CLINIC_OR_DEPARTMENT_OTHER)
Admission: EM | Admit: 2023-11-13 | Discharge: 2023-11-13 | Disposition: A | Discharge status: Home / Self Care | Attending: Emergency Medicine | Admitting: Emergency Medicine

## 2023-11-13 DIAGNOSIS — K029 Dental caries, unspecified: Secondary | ICD-10-CM | POA: Diagnosis not present

## 2023-11-13 DIAGNOSIS — K0889 Other specified disorders of teeth and supporting structures: Secondary | ICD-10-CM

## 2023-11-13 MED ORDER — AMOXICILLIN 500 MG PO CAPS
500.0000 mg | ORAL_CAPSULE | Freq: Once | ORAL | Status: AC
  Administered 2023-11-13: 500 mg via ORAL
  Filled 2023-11-13: qty 1

## 2023-11-13 MED ORDER — IBUPROFEN 400 MG PO TABS
400.0000 mg | ORAL_TABLET | Freq: Once | ORAL | Status: AC
  Administered 2023-11-13: 400 mg via ORAL
  Filled 2023-11-13: qty 1

## 2023-11-13 MED ORDER — AMOXICILLIN 500 MG PO CAPS: 500.0000 mg | ORAL_CAPSULE | Freq: Two times a day (BID) | ORAL | 0 refills | Status: AC

## 2023-11-13 NOTE — ED Provider Notes (Signed)
 [place] [facility] Provider Note   CSN: [id number] Arrival date & time: 11/12/23  2101     History  Chief Complaint  Patient presents with   Dental Pain    Todd Luna is a 73 y.o. male.  The history is provided by the patient. The history is limited by a language barrier. A language interpreter was used [place] [facility] [place]).  Dental Pain Location:  Lower Quality:  Aching Severity:  Moderate Onset quality:  Gradual Duration:  1 day Progression:  Worsening Chronicity:  New Worsened by:  Jaw movement Associated symptoms: fever   Patient reports pain in his right lower jaw.  He reports fever and chills.  No vomiting.  No chest pain     Home Medications Prior to Admission medications   Medication Sig Start Date End Date Taking? Authorizing Provider  amoxicillin (AMOXIL) 500 MG capsule Take 1 capsule (500 mg total) by mouth 2 (two) times daily. 11/13/23  Yes Zadie Rhine, MD  amLODipine (NORVASC) 5 MG tablet Take 5 mg by mouth daily. 02/03/19   [provider]  atorvastatin (LIPITOR) 40 MG tablet Take 40 mg by mouth daily.    [provider]  cetirizine (ZYRTEC) 10 MG tablet Take 10 mg by mouth daily. 02/02/19   [provider]  docusate sodium (COLACE) 100 MG capsule Take 1 capsule (100 mg total) by mouth every 12 (twelve) hours. 04/30/19   Mesner, Barbara Cower, MD  famotidine (PEPCID) 20 MG tablet Take 1 tablet (20 mg total) by mouth 2 (two) times daily. 04/26/19   Wynetta Fines, MD  losartan-hydrochlorothiazide (HYZAAR) 100-12.5 MG tablet Take 1 tablet by mouth daily.    [provider]  ondansetron (ZOFRAN) 4 MG tablet Take 1 tablet (4 mg total) by mouth every 6 (six) hours. 04/26/19   Wynetta Fines, MD  polyethylene glycol (MIRALAX / GLYCOLAX) 17 g packet Take 17 g by mouth daily. 04/30/19   Mesner, Barbara Cower, MD  promethazine (PHENERGAN) 25 MG suppository Place 1 suppository (25 mg total) rectally  every 6 (six) hours as needed for nausea or vomiting. 05/03/19   Henderly, Britni A, PA-C  sodium phosphate (FLEET) 7-19 GM/118ML ENEM Place 133 mLs (1 enema total) rectally daily as needed for severe constipation. 04/30/19   Mesner, Barbara Cower, MD  chlorproMAZINE (THORAZINE) 50 MG tablet Take 1 tablet (50 mg total) by mouth 4 (four) times daily as needed for hiccoughs. Patient not taking: Reported on 04/30/2019 09/08/14 04/30/19  Dorothea Ogle, MD  fluticasone [facility]) 50 MCG/ACT nasal spray Place 2 sprays into both nostrils daily. Patient not taking: Reported on 09/06/2014 12/27/13 04/30/19  Hamilton Capri P, DO      Allergies    Patient has no known allergies.    Review of Systems   Review of Systems  Constitutional:  Positive for chills and fever.  HENT:  Positive for dental problem.   Cardiovascular:  Negative for chest pain.  Gastrointestinal:  Negative for vomiting.    Physical Exam Updated Vital Signs BP (!) 183/92   Pulse 92   Temp 98.2 F (36.8 C)   Resp 18   Ht 1.676 m (5\' 6" )   Wt 76.2 kg   SpO2 96%   BMI 27.12 kg/m  Physical Exam CONSTITUTIONAL: Well developed/well nourished HEAD AND FACE: Normocephalic/atraumatic ENMT: Mucous membranes moist.  Poor dentition.  No trismus.  No focal abscess noted. No drooling, no stridor NECK: supple no meningeal signs CV: S1/S2  noted, no murmurs/rubs/gallops noted LUNGS: Lungs are clear to auscultation bilaterally, no apparent distress ABDOMEN: soft, nontender, no rebound or guarding NEURO: Pt is awake/alert, moves all extremitiesx4 EXTREMITIES:full ROM SKIN: warm, color normal  ED Results / Procedures / Treatments   Labs (all labs ordered are listed, but only abnormal results are displayed) Labs Reviewed - No data to display  EKG None  Radiology No results found.  Procedures Procedures    Medications Ordered in ED Medications  amoxicillin (AMOXIL) capsule 500 mg (has no administration in time range)  ibuprofen (ADVIL) tablet  400 mg (has no administration in time range)    ED Course/ Medical Decision Making/ A&P                                 Medical Decision Making Risk Prescription drug management.   Patient reports dental pain, he has poor dentition.  Reports dental appointment is not until next month.  Will place on amoxicillin, advised ibuprofen, given a new dental referral No signs of deep space infection, patient is in no acute distress, he is safe for discharge home Interpreter utilized throughout my ED evaluation of the patient        Final Clinical Impression(s) / ED Diagnoses Final diagnoses:  Dental caries  Pain, dental    Rx / DC Orders ED Discharge Orders          Ordered    amoxicillin (AMOXIL) 500 MG capsule  2 times daily        11/13/23 0126              Zadie Rhine, MD 11/13/23 [phone]
